# Patient Record
Sex: Female | Born: 1960 | Race: Black or African American | Hispanic: No | Marital: Single | Smoking: Never smoker
Health system: Southern US, Community
[De-identification: ages and names within clinical notes are randomized; demographics above are authoritative.]

## PROBLEM LIST (undated history)

## (undated) DIAGNOSIS — J40 Bronchitis, not specified as acute or chronic: Secondary | ICD-10-CM

## (undated) DIAGNOSIS — G4733 Obstructive sleep apnea (adult) (pediatric): Secondary | ICD-10-CM

## (undated) HISTORY — DX: Obstructive sleep apnea (adult) (pediatric): G47.33

## (undated) HISTORY — PX: REDUCTION MAMMAPLASTY: SUR839

## (undated) HISTORY — PX: POLYPECTOMY: SHX149

## (undated) HISTORY — PX: COLONOSCOPY: SHX174

## (undated) HISTORY — PX: ABDOMINAL HYSTERECTOMY: SHX81

---

## 2002-07-11 HISTORY — PX: BREAST REDUCTION SURGERY: SHX8

## 2006-07-11 HISTORY — PX: OTHER SURGICAL HISTORY: SHX169

## 2017-06-06 ENCOUNTER — Ambulatory Visit: Payer: Self-pay | Admitting: *Deleted

## 2017-06-06 ENCOUNTER — Encounter: Payer: Self-pay | Admitting: Emergency Medicine

## 2017-06-06 ENCOUNTER — Ambulatory Visit (INDEPENDENT_AMBULATORY_CARE_PROVIDER_SITE_OTHER): Payer: 59 | Admitting: Emergency Medicine

## 2017-06-06 VITALS — BP 122/72 | HR 89 | Temp 99.1°F | Resp 17 | Ht 62.0 in | Wt 179.0 lb

## 2017-06-06 DIAGNOSIS — R6889 Other general symptoms and signs: Secondary | ICD-10-CM | POA: Insufficient documentation

## 2017-06-06 DIAGNOSIS — J09X2 Influenza due to identified novel influenza A virus with other respiratory manifestations: Secondary | ICD-10-CM | POA: Diagnosis not present

## 2017-06-06 DIAGNOSIS — J040 Acute laryngitis: Secondary | ICD-10-CM

## 2017-06-06 LAB — POC INFLUENZA A&B (BINAX/QUICKVUE)
INFLUENZA A, POC: POSITIVE — AB
Influenza B, POC: NEGATIVE

## 2017-06-06 MED ORDER — PROMETHAZINE-CODEINE 6.25-10 MG/5ML PO SYRP: 5.0000 mL | ORAL_SOLUTION | Freq: Every evening | ORAL | 0 refills | Status: DC | PRN

## 2017-06-06 MED ORDER — AZITHROMYCIN 250 MG PO TABS: ORAL_TABLET | ORAL | 0 refills | Status: DC

## 2017-06-06 MED ORDER — PREDNISONE 20 MG PO TABS: 40.0000 mg | ORAL_TABLET | Freq: Every day | ORAL | 0 refills | Status: AC

## 2017-06-06 NOTE — Progress Notes (Signed)
Jane Heath 56 y.o.   Chief Complaint  Patient presents with  . Nasal Congestion  . Cough    HISTORY OF PRESENT ILLNESS: This is a 56 y.o. female complaining of cough and congestion x 5 days; returned from [facility] 8-9 days ago; losing voice and aching all over.  Influenza  This is a new problem. The current episode started in the past 7 days. The problem has been gradually worsening. Associated symptoms include arthralgias, congestion, coughing, fatigue, headaches, myalgias, a sore throat and weakness. Pertinent negatives include no abdominal pain, anorexia, chest pain, chills, fever, joint swelling, nausea, neck pain, rash, vertigo or vomiting. Treatments tried: cold medicines. The treatment provided no relief.     Prior to Admission medications   Medication Sig Start Date End Date Taking? Authorizing Provider  Phenylephrine-Pheniramine-DM ([place]) 04-30-19 MG PACK Take by mouth.   Yes [provider]    Not on File  There are no active problems to display for this patient.   No past medical history on file.    Social History   Socioeconomic History  . Marital status: Single    Spouse name: Not on file  . Number of children: Not on file  . Years of education: Not on file  . Highest education level: Not on file  Social Needs  . Financial resource strain: Not on file  . Food insecurity - worry: Not on file  . Food insecurity - inability: Not on file  . Transportation needs - medical: Not on file  . Transportation needs - non-medical: Not on file  Occupational History  . Not on file  Tobacco Use  . Smoking status: Never Smoker  . Smokeless tobacco: Never Used  Substance and Sexual Activity  . Alcohol use: Not on file  . Drug use: Not on file  . Sexual activity: Not on file  Other Topics Concern  . Not on file  Social History Narrative  . Not on file    No family history on file.   Review of Systems  Constitutional: Positive  for fatigue. Negative for chills and fever.  HENT: Positive for congestion and sore throat.        Loss of voice.  Eyes: Negative for discharge and redness.  Respiratory: Positive for cough. Negative for shortness of breath and wheezing.   Cardiovascular: Negative for chest pain and leg swelling.  Gastrointestinal: Negative for abdominal pain, anorexia, blood in stool, diarrhea, nausea and vomiting.  Genitourinary: Negative for dysuria, flank pain, frequency and hematuria.  Musculoskeletal: Positive for arthralgias and myalgias. Negative for joint swelling and neck pain.  Skin: Negative for rash.  Neurological: Positive for weakness and headaches. Negative for dizziness and vertigo.  Endo/Heme/Allergies: Negative.   All other systems reviewed and are negative.  Results for orders placed or performed in visit on 06/06/17 (from the past 24 hour(s))  POC Influenza A&B(BINAX/QUICKVUE)     Status: Abnormal   Collection Time: 06/06/17 12:25 PM  Result Value Ref Range   Influenza A, POC Positive (A) Negative   Influenza B, POC Negative Negative    Vitals:   06/06/17 1142  BP: 122/72  Pulse: 89  Resp: 17  Temp: 99.1 F (37.3 C)  SpO2: 98%    Physical Exam  Constitutional: She is oriented to person, place, and time. She appears well-developed and well-nourished. She appears ill.  HENT:  Head: Normocephalic and atraumatic.  Nose: Nose normal.  Mouth/Throat: Oropharynx is clear and moist.  Eyes: Conjunctivae and EOM are normal. Pupils are equal, round, and reactive to light.  Neck: Normal range of motion. Neck supple. No JVD present. No thyromegaly present.  Cardiovascular: Normal rate, regular rhythm, normal heart sounds and intact distal pulses.  Pulmonary/Chest: Effort normal and breath sounds normal.  Abdominal: Soft. Bowel sounds are normal. She exhibits no distension. There is no tenderness.  Musculoskeletal: Normal range of motion.  Lymphadenopathy:    She has no cervical  adenopathy.  Neurological: She is alert and oriented to person, place, and time. No sensory deficit. She exhibits normal muscle tone.  Skin: Skin is warm and dry. Capillary refill takes less than 2 seconds. No rash noted.  Psychiatric: She has a normal mood and affect. Her behavior is normal.  Vitals reviewed.    ASSESSMENT & PLAN: Jane Heath was seen today for nasal congestion and cough.  Diagnoses and all orders for this visit:  Flu-like symptoms -     POC Influenza A&B(BINAX/QUICKVUE)  Influenza due to identified novel influenza A virus with other respiratory manifestations  Laryngitis  Other orders -     predniSONE (DELTASONE) 20 MG tablet; Take 2 tablets (40 mg total) by mouth daily with breakfast for 5 days. -     azithromycin (ZITHROMAX) 250 MG tablet; Sig as indicated -     promethazine-codeine (PHENERGAN WITH CODEINE) 6.25-10 MG/5ML syrup; Take 5 mLs by mouth at bedtime as needed for cough.    Patient Instructions       IF you received an x-ray today, you will receive an invoice from [facility]. Please contact [facility] at [phone] with questions or concerns regarding your invoice.   IF you received labwork today, you will receive an invoice from [facility]. Please contact [facility] at [phone] with questions or concerns regarding your invoice.   Our billing staff will not be able to assist you with questions regarding bills from these companies.  You will be contacted with the lab results as soon as they are available. The fastest way to get your results is to activate your My Chart account. Instructions are located on the last page of this paperwork. If you have not heard from [place] regarding the results in 2 weeks, please contact this office.      Influenza, Adult Influenza ("the flu") is an infection in the lungs, nose, and throat (respiratory tract). It is caused by a virus. The flu causes many common cold symptoms, as well as a high fever  and body aches. It can make you feel very sick. The flu spreads easily from person to person (is contagious). Getting a flu shot (influenza vaccination) every year is the best way to prevent the flu. Follow these instructions at home:  Take over-the-counter and prescription medicines only as told by your doctor.  Use a cool mist humidifier to add moisture (humidity) to the air in your home. This can make it easier to breathe.  Rest as needed.  Drink enough fluid to keep your pee (urine) clear or pale yellow.  Cover your mouth and nose when you cough or sneeze.  Wash your hands with soap and water often, especially after you cough or sneeze. If you cannot use soap and water, use hand sanitizer.  Stay home from work or school as told by your doctor. Unless you are visiting your doctor, try to avoid leaving home until your fever has been gone for 24 hours without the use of medicine.  Keep all follow-up visits as told by  your doctor. This is important. How is this prevented?  Getting a yearly (annual) flu shot is the best way to avoid getting the flu. You may get the flu shot in late summer, fall, or winter. Ask your doctor when you should get your flu shot.  Wash your hands often or use hand sanitizer often.  Avoid contact with people who are sick during cold and flu season.  Eat healthy foods.  Drink plenty of fluids.  Get enough sleep.  Exercise regularly. Contact a doctor if:  You get new symptoms.  You have: ? Chest pain. ? Watery poop (diarrhea). ? A fever.  Your cough gets worse.  You start to have more mucus.  You feel sick to your stomach (nauseous).  You throw up (vomit). Get help right away if:  You start to be short of breath or have trouble breathing.  Your skin or nails turn a bluish color.  You have very bad pain or stiffness in your neck.  You get a sudden headache.  You get sudden pain in your face or ear.  You cannot stop throwing up. This  information is not intended to replace advice given to you by your health care provider. Make sure you discuss any questions you have with your health care provider. Document Released: 04/05/2008 Document Revised: 12/03/2015 Document Reviewed: 04/21/2015 Elsevier Interactive Patient Education  2017 Elsevier Inc.      [name], MD [facility]

## 2017-06-06 NOTE — Patient Instructions (Addendum)
     IF you received an x-ray today, you will receive an invoice from [facility]. Please contact [facility] at [phone] with questions or concerns regarding your invoice.   IF you received labwork today, you will receive an invoice from [facility]. Please contact [facility] at [phone] with questions or concerns regarding your invoice.   Our billing staff will not be able to assist you with questions regarding bills from these companies.  You will be contacted with the lab results as soon as they are available. The fastest way to get your results is to activate your My Chart account. Instructions are located on the last page of this paperwork. If you have not heard from us regarding the results in 2 weeks, please contact this office.      Influenza, Adult Influenza ("the flu") is an infection in the lungs, nose, and throat (respiratory tract). It is caused by a virus. The flu causes many common cold symptoms, as well as a high fever and body aches. It can make you feel very sick. The flu spreads easily from person to person (is contagious). Getting a flu shot (influenza vaccination) every year is the best way to prevent the flu. Follow these instructions at home:  Take over-the-counter and prescription medicines only as told by your doctor.  Use a cool mist humidifier to add moisture (humidity) to the air in your home. This can make it easier to breathe.  Rest as needed.  Drink enough fluid to keep your pee (urine) clear or pale yellow.  Cover your mouth and nose when you cough or sneeze.  Wash your hands with soap and water often, especially after you cough or sneeze. If you cannot use soap and water, use hand sanitizer.  Stay home from work or school as told by your doctor. Unless you are visiting your doctor, try to avoid leaving home until your fever has been gone for 24 hours without the use of medicine.  Keep all follow-up visits as told by your doctor.  This is important. How is this prevented?  Getting a yearly (annual) flu shot is the best way to avoid getting the flu. You may get the flu shot in late summer, fall, or winter. Ask your doctor when you should get your flu shot.  Wash your hands often or use hand sanitizer often.  Avoid contact with people who are sick during cold and flu season.  Eat healthy foods.  Drink plenty of fluids.  Get enough sleep.  Exercise regularly. Contact a doctor if:  You get new symptoms.  You have:  Chest pain.  Watery poop (diarrhea).  A fever.  Your cough gets worse.  You start to have more mucus.  You feel sick to your stomach (nauseous).  You throw up (vomit). Get help right away if:  You start to be short of breath or have trouble breathing.  Your skin or nails turn a bluish color.  You have very bad pain or stiffness in your neck.  You get a sudden headache.  You get sudden pain in your face or ear.  You cannot stop throwing up. This information is not intended to replace advice given to you by your health care provider. Make sure you discuss any questions you have with your health care provider. Document Released: 04/05/2008 Document Revised: 12/03/2015 Document Reviewed: 04/21/2015 Elsevier Interactive Patient Education  2017 Elsevier Inc.  

## 2017-06-06 NOTE — Telephone Encounter (Signed)
  Reason for Disposition . Patient sounds very sick or weak to the triager  Answer Assessment - Initial Assessment Questions 1. LOCATION: "Where does it hurt?"      Ear, teeth pain, chest pain 2. ONSET: "When did the sinus pain start?"  (e.g., hours, days)      Started last Friday morning 3. SEVERITY: "How bad is the pain?"   (Scale 1-10; mild, moderate or severe)   - MILD (1-3): doesn't interfere with normal activities    - MODERATE (4-7): interferes with normal activities (e.g., work or school) or awakens from sleep   - SEVERE (8-10): excruciating pain and patient unable to do any normal activities        7 4. RECURRENT SYMPTOM: "Have you ever had sinus problems before?" If so, ask: "When was the last time?" and "What happened that time?"      Never been this sick before 5. NASAL CONGESTION: "Is the nose blocked?" If so, ask, "Can you open it or must you breathe through the mouth?"     Patient had nose bleed this am- she did manage to get it to stop. She is congested- patient is breathing through her mouth 6. NASAL DISCHARGE: "Do you have discharge from your nose?" If so ask, "What color?"      White discharge 7. FEVER: "Do you have a fever?" If so, ask: "What is it, how was it measured, and when did it start?"      no 8. OTHER SYMPTOMS: "Do you have any other symptoms?" (e.g., sore throat, cough, earache, difficulty breathing)     Sore throat, cough, earache, difficulty breathing 9. PREGNANCY: "Is there any chance you are pregnant?" "When was your last menstrual period?"     n/a  Protocols used: SINUS PAIN OR CONGESTION-A-AH

## 2017-08-25 ENCOUNTER — Encounter: Payer: Self-pay | Admitting: Emergency Medicine

## 2017-08-25 ENCOUNTER — Other Ambulatory Visit: Payer: Self-pay

## 2017-08-25 ENCOUNTER — Ambulatory Visit (INDEPENDENT_AMBULATORY_CARE_PROVIDER_SITE_OTHER): Payer: 59 | Admitting: Emergency Medicine

## 2017-08-25 VITALS — BP 120/84 | HR 74 | Temp 97.8°F | Resp 16 | Ht 60.75 in | Wt 180.6 lb

## 2017-08-25 DIAGNOSIS — R4189 Other symptoms and signs involving cognitive functions and awareness: Secondary | ICD-10-CM | POA: Diagnosis not present

## 2017-08-25 DIAGNOSIS — R6889 Other general symptoms and signs: Secondary | ICD-10-CM | POA: Diagnosis not present

## 2017-08-25 NOTE — Patient Instructions (Signed)
     IF you received an x-ray today, you will receive an invoice from [facility]. Please contact [facility] at [phone] with questions or concerns regarding your invoice.   IF you received labwork today, you will receive an invoice from [facility]. Please contact [facility] at [phone] with questions or concerns regarding your invoice.   Our billing staff will not be able to assist you with questions regarding bills from these companies.  You will be contacted with the lab results as soon as they are available. The fastest way to get your results is to activate your My Chart account. Instructions are located on the last page of this paperwork. If you have not heard from us regarding the results in 2 weeks, please contact this office.     

## 2017-08-25 NOTE — Progress Notes (Signed)
Jane Heath 57 y.o.   Chief Complaint  Patient presents with  . Referral    Neuro, "I' am beginning to forget alot", going on for 7 years, have gotten worse in the last 2 yrs.    HISTORY OF PRESENT ILLNESS: This is a 57 y.o. female complaining of frequent episodes of forgetfulness getting worse the last year or 2.  Has had periods of confusion and disorientation.  Concerned about early onset dementia.  No other significant symptoms.  No significant past medical history.  No history of chronic diseases.  Presently on no medications.  There is some family history of dementia.  Stress level 7 out of 10.  HPI   Prior to Admission medications   Not on File    No Known Allergies  Patient Active Problem List   Diagnosis Date Noted  . Flu-like symptoms 06/06/2017  . Influenza due to identified novel influenza A virus with other respiratory manifestations 06/06/2017  . Laryngitis 06/06/2017    No past medical history on file.  No past surgical history on file.  Social History   Socioeconomic History  . Marital status: Single    Spouse name: Not on file  . Number of children: Not on file  . Years of education: Not on file  . Highest education level: Not on file  Social Needs  . Financial resource strain: Not on file  . Food insecurity - worry: Not on file  . Food insecurity - inability: Not on file  . Transportation needs - medical: Not on file  . Transportation needs - non-medical: Not on file  Occupational History  . Not on file  Tobacco Use  . Smoking status: Never Smoker  . Smokeless tobacco: Never Used  Substance and Sexual Activity  . Alcohol use: Not on file  . Drug use: Not on file  . Sexual activity: Not on file  Other Topics Concern  . Not on file  Social History Narrative  . Not on file    No family history on file.   Review of Systems  Constitutional: Negative.  Negative for chills, fever and weight loss.  HENT: Negative.  Negative for  congestion, nosebleeds and sore throat.   Eyes: Negative.  Negative for blurred vision and double vision.  Respiratory: Negative.  Negative for cough and shortness of breath.   Cardiovascular: Negative.  Negative for chest pain, palpitations and leg swelling.  Gastrointestinal: Negative.  Negative for abdominal pain, blood in stool, diarrhea, nausea and vomiting.  Genitourinary: Negative.  Negative for dysuria and hematuria.  Musculoskeletal: Negative.  Negative for back pain, myalgias and neck pain.  Skin: Negative.  Negative for rash.  Neurological: Negative.  Negative for dizziness, sensory change, speech change, focal weakness, seizures, loss of consciousness and headaches.  Endo/Heme/Allergies: Negative.   All other systems reviewed and are negative.   Vitals:   08/25/17 1738  BP: 120/84  Pulse: 74  Resp: 16  Temp: 97.8 F (36.6 C)  SpO2: 100%    Physical Exam  Constitutional: She is oriented to person, place, and time. She appears well-developed and well-nourished.  HENT:  Head: Normocephalic and atraumatic.  Right Ear: External ear normal.  Left Ear: External ear normal.  Nose: Nose normal.  Mouth/Throat: Oropharynx is clear and moist.  Eyes: Conjunctivae are normal. Pupils are equal, round, and reactive to light.  Neck: Normal range of motion. Neck supple. No JVD present. No thyromegaly present.  Cardiovascular: Normal rate, regular rhythm and normal heart  sounds.  Pulmonary/Chest: Effort normal and breath sounds normal.  Abdominal: Soft. Bowel sounds are normal. She exhibits no distension. There is no tenderness.  Musculoskeletal: Normal range of motion.  Lymphadenopathy:    She has no cervical adenopathy.  Neurological: She is alert and oriented to person, place, and time. No sensory deficit. She exhibits normal muscle tone.  Skin: Skin is warm and dry. Capillary refill takes less than 2 seconds. No rash noted.  Psychiatric: She has a normal mood and affect. Her  behavior is normal.  Vitals reviewed.    ASSESSMENT & PLAN: Jane Heath was seen today for referral.  Diagnoses and all orders for this visit:  Forgetfulness -     CBC with Differential/Platelet -     Comprehensive metabolic panel -     Hemoglobin A1c -     RPR -     HIV antibody -     Hepatitis C antibody -     Vitamin B12 -     TSH -     Ambulatory referral to Neurology  Episode of altered cognition -     Ambulatory referral to Neurology      [name], MD [facility]

## 2017-08-28 ENCOUNTER — Ambulatory Visit: Payer: Self-pay | Admitting: Family Medicine

## 2017-08-28 ENCOUNTER — Ambulatory Visit: Payer: 59

## 2017-08-29 ENCOUNTER — Encounter: Payer: Self-pay | Admitting: *Deleted

## 2017-08-29 LAB — COMPREHENSIVE METABOLIC PANEL
ALBUMIN: 4.5 g/dL (ref 3.5–5.5)
ALK PHOS: 63 IU/L (ref 39–117)
ALT: 16 IU/L (ref 0–32)
AST: 20 IU/L (ref 0–40)
Albumin/Globulin Ratio: 1.8 (ref 1.2–2.2)
BILIRUBIN TOTAL: 0.2 mg/dL (ref 0.0–1.2)
BUN / CREAT RATIO: 15 (ref 9–23)
BUN: 13 mg/dL (ref 6–24)
CHLORIDE: 102 mmol/L (ref 96–106)
CO2: 23 mmol/L (ref 20–29)
Calcium: 9.6 mg/dL (ref 8.7–10.2)
Creatinine, Ser: 0.86 mg/dL (ref 0.57–1.00)
GFR calc Af Amer: 87 mL/min/{1.73_m2} (ref >59)
GFR calc non Af Amer: 76 mL/min/{1.73_m2} (ref >59)
Globulin, Total: 2.5 g/dL (ref 1.5–4.5)
Glucose: 93 mg/dL (ref 65–99)
Potassium: 4.3 mmol/L (ref 3.5–5.2)
Sodium: 140 mmol/L (ref 134–144)
Total Protein: 7 g/dL (ref 6.0–8.5)

## 2017-08-29 LAB — CBC WITH DIFFERENTIAL/PLATELET
BASOS ABS: 0 10*3/uL (ref 0.0–0.2)
Basos: 1 %
EOS (ABSOLUTE): 0.1 10*3/uL (ref 0.0–0.4)
Eos: 2 %
HEMOGLOBIN: 13 g/dL (ref 11.1–15.9)
Hematocrit: 38.3 % (ref 34.0–46.6)
Immature Grans (Abs): 0 10*3/uL (ref 0.0–0.1)
Immature Granulocytes: 0 %
LYMPHS ABS: 1.4 10*3/uL (ref 0.7–3.1)
Lymphs: 21 %
MCH: 28.9 pg (ref 26.6–33.0)
MCHC: 33.9 g/dL (ref 31.5–35.7)
MCV: 85 fL (ref 79–97)
MONOCYTES: 6 %
MONOS ABS: 0.4 10*3/uL (ref 0.1–0.9)
NEUTROS ABS: 4.7 10*3/uL (ref 1.4–7.0)
Neutrophils: 70 %
PLATELETS: 290 10*3/uL (ref 150–379)
RBC: 4.5 x10E6/uL (ref 3.77–5.28)
RDW: 14 % (ref 12.3–15.4)
WBC: 6.6 10*3/uL (ref 3.4–10.8)

## 2017-08-29 LAB — TSH: TSH: 1.73 u[IU]/mL (ref 0.450–4.500)

## 2017-08-29 LAB — HEMOGLOBIN A1C
ESTIMATED AVERAGE GLUCOSE: 123 mg/dL
HEMOGLOBIN A1C: 5.9 % — AB (ref 4.8–5.6)

## 2017-08-29 LAB — HEPATITIS C ANTIBODY: Hep C Virus Ab: <0.1 s/co ratio (ref 0.0–0.9)

## 2017-08-29 LAB — HIV ANTIBODY (ROUTINE TESTING W REFLEX): HIV Screen 4th Generation wRfx: NONREACTIVE

## 2017-08-29 LAB — VITAMIN B12: Vitamin B-12: >2000 pg/mL — ABNORMAL HIGH (ref 232–1245)

## 2017-08-29 LAB — RPR: RPR Ser Ql: NONREACTIVE

## 2017-11-10 ENCOUNTER — Encounter: Payer: Self-pay | Admitting: Diagnostic Neuroimaging

## 2017-11-10 ENCOUNTER — Ambulatory Visit (INDEPENDENT_AMBULATORY_CARE_PROVIDER_SITE_OTHER): Payer: 59 | Admitting: Diagnostic Neuroimaging

## 2017-11-10 VITALS — BP 127/82 | HR 72 | Ht 60.75 in | Wt 188.0 lb

## 2017-11-10 DIAGNOSIS — R413 Other amnesia: Secondary | ICD-10-CM | POA: Diagnosis not present

## 2017-11-10 DIAGNOSIS — G4733 Obstructive sleep apnea (adult) (pediatric): Secondary | ICD-10-CM
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
## 2017-11-10 NOTE — Patient Instructions (Signed)
  Thank you for coming to see [place] at [facility]. I hope we have been able to provide you high quality care today.  You may receive a patient satisfaction survey over the next few weeks. We would appreciate your feedback and comments so that we may continue to improve ourselves and the health of our patients.  - check MRI brain, sleep study, neuropsychology testing   ~~~~~~~~~~~~~~~~~~~~~~~~~~~~~~~~~~~~~~~~~~~~~~~~~~~~~~~~~~~~~~~~~  DR. Manson Luckadoo'S GUIDE TO HAPPY AND HEALTHY LIVING These are some of my general health and wellness recommendations. Some of them may apply to you better than others. Please use common sense as you try these suggestions and feel free to ask me any questions.   ACTIVITY/FITNESS Mental, social, emotional and physical stimulation are very important for brain and body health. Try learning a new activity (arts, music, language, sports, games).  Keep moving your body to the best of your abilities. You can do this at home, inside or outside, the park, community center, gym or anywhere you like. Consider a physical therapist or personal trainer to get started. Fitness trackers, smart-watches or  smart-phones can help as well.   NUTRITION Eat more plants: colorful vegetables, nuts, seeds and berries.  Eat less sugar, salt, preservatives and processed foods.  Avoid toxins such as cigarettes and alcohol.  Drink water when you are thirsty. Warm water with a slice of lemon is an excellent morning drink to start the day.  Consider these websites for more information The Nutrition Source (https://www.henry-hernandez.biz/) Precision Nutrition (WindowBlog.ch)   RELAXATION Consider practicing mindfulness meditation or other relaxation techniques such as deep breathing, prayer, yoga, tai chi, massage. See website mindful.org or the apps Headspace or Calm to help get started.   SLEEP Try to get at least 7-8+  hours sleep per day. Regular exercise and reduced caffeine will help you sleep better. Practice good sleep hygeine techniques. See website sleep.org for more information.   PLANNING Prepare estate planning, living will, healthcare POA documents. Sometimes this is best planned with the help of an attorney. Theconversationproject.org and agingwithdignity.org are excellent resources.

## 2017-11-14 ENCOUNTER — Ambulatory Visit: Payer: 59

## 2017-11-14 DIAGNOSIS — R413 Other amnesia: Secondary | ICD-10-CM

## 2017-11-14 MED ORDER — GADOPENTETATE DIMEGLUMINE 469.01 MG/ML IV SOLN
17.0000 mL | Freq: Once | INTRAVENOUS | Status: AC | PRN
  Administered 2017-11-14: 17 mL via INTRAVENOUS

## 2017-11-20 ENCOUNTER — Encounter: Payer: Self-pay | Admitting: Psychology

## 2017-11-24 ENCOUNTER — Telehealth: Payer: Self-pay | Admitting: *Deleted

## 2017-11-24 NOTE — Telephone Encounter (Signed)
-----   Message from Penni Bombard, MD sent at 11/23/2017  3:22 PM EDT ----- Unremarkable imaging results. Please call patient. Continue current plan. -VRP

## 2017-11-24 NOTE — Telephone Encounter (Signed)
Pt returned call and is asking more questions regarding her MRI, what matter changes, benign cyst.  Please call [phone].

## 2017-11-24 NOTE — Telephone Encounter (Signed)
LMVM for pt that her MRI unremarkable. Continue current plan.  She is to call back If questions.

## 2017-11-28 NOTE — Telephone Encounter (Signed)
I called patient. Reviewed MRI results. No major findings. Continue current plan. -VRP

## 2017-12-07 ENCOUNTER — Telehealth: Payer: Self-pay | Admitting: Diagnostic Neuroimaging

## 2017-12-07 NOTE — Telephone Encounter (Signed)
LMVM for pt to return call.   

## 2017-12-07 NOTE — Telephone Encounter (Signed)
Pt requesting a call, stating she would like to discuss her memory related issues. Pt didn't wan to go into further detail with me

## 2017-12-08 NOTE — Telephone Encounter (Signed)
I spoke to pt and she was dismissed from her job and now has no insurance.  She had MRI, but the other sleep consult and neuropsych appts are scheduled at this point.  She was given dates and times of these appts and I relayed that if it comes close to time and she still does not have insurance she will call prior to 24 hours to cancell so as not to be  Charged a no show fee.   She was appreciative of help and verbalized understanding.

## 2018-01-17 ENCOUNTER — Institutional Professional Consult (permissible substitution): Payer: 59 | Admitting: Neurology

## 2018-02-08 ENCOUNTER — Encounter: Payer: Self-pay | Admitting: Diagnostic Neuroimaging

## 2018-04-24 ENCOUNTER — Encounter: Payer: Self-pay | Admitting: Psychology
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
## 2018-05-22 ENCOUNTER — Ambulatory Visit: Payer: 59 | Admitting: Diagnostic Neuroimaging

## 2018-05-31 ENCOUNTER — Encounter: Payer: Self-pay | Admitting: Psychology

## 2018-06-04 ENCOUNTER — Ambulatory Visit: Payer: Self-pay | Admitting: Diagnostic Neuroimaging

## 2018-09-20 ENCOUNTER — Ambulatory Visit (INDEPENDENT_AMBULATORY_CARE_PROVIDER_SITE_OTHER): Payer: BLUE CROSS/BLUE SHIELD | Admitting: Emergency Medicine

## 2018-09-20 ENCOUNTER — Other Ambulatory Visit: Payer: Self-pay

## 2018-09-20 ENCOUNTER — Encounter: Payer: Self-pay | Admitting: Emergency Medicine

## 2018-09-20 VITALS — BP 130/87 | HR 81 | Temp 98.1°F | Resp 16 | Wt 185.0 lb

## 2018-09-20 DIAGNOSIS — Z13220 Encounter for screening for lipoid disorders: Secondary | ICD-10-CM

## 2018-09-20 DIAGNOSIS — Z1329 Encounter for screening for other suspected endocrine disorder: Secondary | ICD-10-CM | POA: Diagnosis not present

## 2018-09-20 DIAGNOSIS — Z0001 Encounter for general adult medical examination with abnormal findings: Secondary | ICD-10-CM

## 2018-09-20 DIAGNOSIS — F419 Anxiety disorder, unspecified: Secondary | ICD-10-CM | POA: Diagnosis not present

## 2018-09-20 DIAGNOSIS — Z1211 Encounter for screening for malignant neoplasm of colon: Secondary | ICD-10-CM

## 2018-09-20 DIAGNOSIS — R7303 Prediabetes: Secondary | ICD-10-CM

## 2018-09-20 DIAGNOSIS — Z1239 Encounter for other screening for malignant neoplasm of breast: Secondary | ICD-10-CM

## 2018-09-20 DIAGNOSIS — R6889 Other general symptoms and signs: Secondary | ICD-10-CM

## 2018-09-20 DIAGNOSIS — Z0000 Encounter for general adult medical examination without abnormal findings: Secondary | ICD-10-CM

## 2018-09-20 DIAGNOSIS — Z13228 Encounter for screening for other metabolic disorders: Secondary | ICD-10-CM | POA: Diagnosis not present

## 2018-09-20 DIAGNOSIS — Z130 Encounter for screening for diseases of the blood and blood-forming organs and certain disorders involving the immune mechanism: Secondary | ICD-10-CM

## 2018-09-20 LAB — POCT GLYCOSYLATED HEMOGLOBIN (HGB A1C): Hemoglobin A1C: 5.9 % — AB (ref 4.0–5.6)

## 2018-09-20 LAB — GLUCOSE, POCT (MANUAL RESULT ENTRY): POC Glucose: 90 mg/dl (ref 70–99)

## 2018-09-20 NOTE — Progress Notes (Signed)
Jane Heath 58 y.o.   No chief complaint on file.   HISTORY OF PRESENT ILLNESS: This is a 58 y.o. female here for annual exam. Complaining of chronic anxiety and depression.  Also has a history of forgetfulness, referred in the past to [facility] requesting referral back to them. Past medical history of prediabetes.  Was on metformin for some time.  Presently not taking any medication. Also has a history of a total hysterectomy in the past. No other complaints or medical concerns at this time. HPI   Prior to Admission medications   Medication Sig Start Date End Date Taking? Authorizing Provider  metFORMIN (GLUCOPHAGE) 500 MG tablet Take 500 mg by mouth daily.    [provider]    No Known Allergies  Patient Active Problem List   Diagnosis Date Noted  . Flu-like symptoms 06/06/2017  . Influenza due to identified novel influenza A virus with other respiratory manifestations 06/06/2017  . Laryngitis 06/06/2017    No past medical history on file.  Past Surgical History:  Procedure Laterality Date  . ABDOMINAL HYSTERECTOMY     1987  . BREAST REDUCTION SURGERY  2004  . tummy tuck  2008    Social History   Socioeconomic History  . Marital status: Single    Spouse name: Not on file  . Number of children: Not on file  . Years of education: Not on file  . Highest education level: Not on file  Occupational History  . Not on file  Social Needs  . Financial resource strain: Not on file  . Food insecurity:    Worry: Not on file    Inability: Not on file  . Transportation needs:    Medical: Not on file    Non-medical: Not on file  Tobacco Use  . Smoking status: Never Smoker  . Smokeless tobacco: Never Used  Substance and Sexual Activity  . Alcohol use: Yes    Comment: 2 x week  . Drug use: Never  . Sexual activity: Not on file  Lifestyle  . Physical activity:    Days per week: Not on file    Minutes per session: Not on file   . Stress: Not on file  Relationships  . Social connections:    Talks on phone: Not on file    Gets together: Not on file    Attends religious service: Not on file    Active member of club or organization: Not on file    Attends meetings of clubs or organizations: Not on file    Relationship status: Not on file  . Intimate partner violence:    Fear of current or ex partner: Not on file    Emotionally abused: Not on file    Physically abused: Not on file    Forced sexual activity: Not on file  Other Topics Concern  . Not on file  Social History Narrative   Lives with mother.  Works for [employer].  Education college 2 yrs.  Divorced.  No children.  Caffeine 12-16 oz daily.    Family History  Problem Relation Age of Onset  . Hypertension Mother   . Heart disease Mother   . Hypertension Sister   . Diabetes Brother   . Hypertension Brother   . Heart disease Maternal Grandmother   . Alzheimer's disease Other    Depression screen [facility] 2/9 08/25/2017 06/06/2017  Decreased Interest 0 0  Down, Depressed, Hopeless 0 0  PHQ - 2 Score  0 0     Review of Systems  Constitutional: Negative.  Negative for chills, fever and weight loss.  HENT: Negative.   Eyes: Negative.  Negative for blurred vision and double vision.  Respiratory: Negative.  Negative for cough and shortness of breath.   Cardiovascular: Negative.  Negative for chest pain and palpitations.  Gastrointestinal: Negative.  Negative for abdominal pain, diarrhea, nausea and vomiting.  Genitourinary: Negative.  Negative for dysuria.  Musculoskeletal: Negative.  Negative for myalgias and neck pain.  Skin: Negative.  Negative for rash.  Neurological: Negative.  Negative for dizziness and headaches.  Psychiatric/Behavioral: Positive for depression. The patient is nervous/anxious.   All other systems reviewed and are negative.   Vitals:   09/20/18 1105  BP: 130/87  Pulse: 81  Resp: 16  Temp: 98.1 F (36.7 C)  SpO2: 96%     Physical Exam Vitals signs reviewed.  Constitutional:      Appearance: Normal appearance.  HENT:     Head: Normocephalic and atraumatic.     Nose: Nose normal.     Mouth/Throat:     Mouth: Mucous membranes are moist.     Pharynx: Oropharynx is clear.  Eyes:     Extraocular Movements: Extraocular movements intact.     Conjunctiva/sclera: Conjunctivae normal.     Pupils: Pupils are equal, round, and reactive to light.  Neck:     Musculoskeletal: Normal range of motion.  Cardiovascular:     Rate and Rhythm: Normal rate and regular rhythm.     Pulses: Normal pulses.     Heart sounds: Normal heart sounds.  Pulmonary:     Effort: Pulmonary effort is normal.     Breath sounds: Normal breath sounds.  Abdominal:     General: There is no distension.     Palpations: Abdomen is soft. There is no mass.     Tenderness: There is no abdominal tenderness.  Musculoskeletal: Normal range of motion.  Skin:    General: Skin is warm and dry.     Capillary Refill: Capillary refill takes less than 2 seconds.  Neurological:     General: No focal deficit present.     Mental Status: She is alert and oriented to person, place, and time.     Sensory: No sensory deficit.     Motor: No weakness.  Psychiatric:        Mood and Affect: Mood normal.        Behavior: Behavior normal.      Results for orders placed or performed in visit on 09/20/18 (from the past 24 hour(s))  POCT glucose (manual entry)     Status: Normal   Collection Time: 09/20/18 10:45 AM  Result Value Ref Range   POC Glucose 90 70 - 99 mg/dl  POCT glycosylated hemoglobin (Hb A1C)     Status: Abnormal   Collection Time: 09/20/18 10:45 AM  Result Value Ref Range   Hemoglobin A1C 5.9 (A) 4.0 - 5.6 %   HbA1c POC (<> result, manual entry)     HbA1c, POC (prediabetic range)     HbA1c, POC (controlled diabetic range)       ASSESSMENT & PLAN: Jane Heath was seen today for annual exam.  Diagnoses and all orders for this  visit:  Routine general medical examination at a health care facility -     Comprehensive metabolic panel  Screening for deficiency anemia -     CBC with Differential/Platelet  Screening for lipoid disorders -  Lipid panel  Screening for endocrine, metabolic and immunity disorder -     Comprehensive metabolic panel -     Lipid panel -     TSH  Prediabetes -     POCT glucose (manual entry) -     POCT glycosylated hemoglobin (Hb A1C) -     Ambulatory referral to Ophthalmology  Chronic anxiety -     Ambulatory referral to Psychiatry  Forgetfulness -     Ambulatory referral to Neurology  Colon cancer screening -     Ambulatory referral to Gastroenterology  Breast cancer screening -     MM Digital Screening; Future     Patient Instructions   Health Maintenance, Female Adopting a healthy lifestyle and getting preventive care can go a long way to promote health and wellness. Talk with your health care provider about what schedule of regular examinations is right for you. This is a good chance for you to check in with your provider about disease prevention and staying healthy. In between checkups, there are plenty of things you can do on your own. Experts have done a lot of research about which lifestyle changes and preventive measures are most likely to keep you healthy. Ask your health care provider for more information. Weight and diet Eat a healthy diet  Be sure to include plenty of vegetables, fruits, low-fat dairy products, and lean protein.  Do not eat a lot of foods high in solid fats, added sugars, or salt.  Get regular exercise. This is one of the most important things you can do for your health. ? Most adults should exercise for at least 150 minutes each week. The exercise should increase your heart rate and make you sweat (moderate-intensity exercise). ? Most adults should also do strengthening exercises at least twice a week. This is in addition to the  moderate-intensity exercise. Maintain a healthy weight  Body mass index (BMI) is a measurement that can be used to identify possible weight problems. It estimates body fat based on height and weight. Your health care provider can help determine your BMI and help you achieve or maintain a healthy weight.  For females 56 years of age and older: ? A BMI below 18.5 is considered underweight. ? A BMI of 18.5 to 24.9 is normal. ? A BMI of 25 to 29.9 is considered overweight. ? A BMI of 30 and above is considered obese. Watch levels of cholesterol and blood lipids  You should start having your blood tested for lipids and cholesterol at 58 years of age, then have this test every 5 years.  You may need to have your cholesterol levels checked more often if: ? Your lipid or cholesterol levels are high. ? You are older than 58 years of age. ? You are at high risk for heart disease. Cancer screening Lung Cancer  Lung cancer screening is recommended for adults 62-53 years old who are at high risk for lung cancer because of a history of smoking.  A yearly low-dose CT scan of the lungs is recommended for people who: ? Currently smoke. ? Have quit within the past 15 years. ? Have at least a 30-pack-year history of smoking. A pack year is smoking an average of one pack of cigarettes a day for 1 year.  Yearly screening should continue until it has been 15 years since you quit.  Yearly screening should stop if you develop a health problem that would prevent you from having lung cancer treatment. Breast Cancer  Practice breast self-awareness. This means understanding how your breasts normally appear and feel.  It also means doing regular breast self-exams. Let your health care provider know about any changes, no matter how small.  If you are in your 20s or 30s, you should have a clinical breast exam (CBE) by a health care provider every 1-3 years as part of a regular health exam.  If you are 59 or  older, have a CBE every year. Also consider having a breast X-ray (mammogram) every year.  If you have a family history of breast cancer, talk to your health care provider about genetic screening.  If you are at high risk for breast cancer, talk to your health care provider about having an MRI and a mammogram every year.  Breast cancer gene (BRCA) assessment is recommended for women who have family members with BRCA-related cancers. BRCA-related cancers include: ? Breast. ? Ovarian. ? Tubal. ? Peritoneal cancers.  Results of the assessment will determine the need for genetic counseling and BRCA1 and BRCA2 testing. Cervical Cancer Your health care provider may recommend that you be screened regularly for cancer of the pelvic organs (ovaries, uterus, and vagina). This screening involves a pelvic examination, including checking for microscopic changes to the surface of your cervix (Pap test). You may be encouraged to have this screening done every 3 years, beginning at age 52.  For women ages 9-65, health care providers may recommend pelvic exams and Pap testing every 3 years, or they may recommend the Pap and pelvic exam, combined with testing for human papilloma virus (HPV), every 5 years. Some types of HPV increase your risk of cervical cancer. Testing for HPV may also be done on women of any age with unclear Pap test results.  Other health care providers may not recommend any screening for nonpregnant women who are considered low risk for pelvic cancer and who do not have symptoms. Ask your health care provider if a screening pelvic exam is right for you.  If you have had past treatment for cervical cancer or a condition that could lead to cancer, you need Pap tests and screening for cancer for at least 20 years after your treatment. If Pap tests have been discontinued, your risk factors (such as having a new sexual partner) need to be reassessed to determine if screening should resume. Some  women have medical problems that increase the chance of getting cervical cancer. In these cases, your health care provider may recommend more frequent screening and Pap tests. Colorectal Cancer  This type of cancer can be detected and often prevented.  Routine colorectal cancer screening usually begins at 58 years of age and continues through 58 years of age.  Your health care provider may recommend screening at an earlier age if you have risk factors for colon cancer.  Your health care provider may also recommend using home test kits to check for hidden blood in the stool.  A small camera at the end of a tube can be used to examine your colon directly (sigmoidoscopy or colonoscopy). This is done to check for the earliest forms of colorectal cancer.  Routine screening usually begins at age 79.  Direct examination of the colon should be repeated every 5-10 years through 58 years of age. However, you may need to be screened more often if early forms of precancerous polyps or small growths are found. Skin Cancer  Check your skin from head to toe regularly.  Tell your health care provider about any  new moles or changes in moles, especially if there is a change in a mole's shape or color.  Also tell your health care provider if you have a mole that is larger than the size of a pencil eraser.  Always use sunscreen. Apply sunscreen liberally and repeatedly throughout the day.  Protect yourself by wearing long sleeves, pants, a wide-brimmed hat, and sunglasses whenever you are outside. Heart disease, diabetes, and high blood pressure  High blood pressure causes heart disease and increases the risk of stroke. High blood pressure is more likely to develop in: ? People who have blood pressure in the high end of the normal range (130-139/85-89 mm Hg). ? People who are overweight or obese. ? People who are African American.  If you are 41-7 years of age, have your blood pressure checked every  3-5 years. If you are 63 years of age or older, have your blood pressure checked every year. You should have your blood pressure measured twice-once when you are at a hospital or clinic, and once when you are not at a hospital or clinic. Record the average of the two measurements. To check your blood pressure when you are not at a hospital or clinic, you can use: ? An automated blood pressure machine at a pharmacy. ? A home blood pressure monitor.  If you are between 42 years and 50 years old, ask your health care provider if you should take aspirin to prevent strokes.  Have regular diabetes screenings. This involves taking a blood sample to check your fasting blood sugar level. ? If you are at a normal weight and have a low risk for diabetes, have this test once every three years after 58 years of age. ? If you are overweight and have a high risk for diabetes, consider being tested at a younger age or more often. Preventing infection Hepatitis B  If you have a higher risk for hepatitis B, you should be screened for this virus. You are considered at high risk for hepatitis B if: ? You were born in a country where hepatitis B is common. Ask your health care provider which countries are considered high risk. ? Your parents were born in a high-risk country, and you have not been immunized against hepatitis B (hepatitis B vaccine). ? You have HIV or AIDS. ? You use needles to inject street drugs. ? You live with someone who has hepatitis B. ? You have had sex with someone who has hepatitis B. ? You get hemodialysis treatment. ? You take certain medicines for conditions, including cancer, organ transplantation, and autoimmune conditions. Hepatitis C  Blood testing is recommended for: ? Everyone born from [birth year] through 1965. ? Anyone with known risk factors for hepatitis C. Sexually transmitted infections (STIs)  You should be screened for sexually transmitted infections (STIs) including  gonorrhea and chlamydia if: ? You are sexually active and are younger than 58 years of age. ? You are older than 58 years of age and your health care provider tells you that you are at risk for this type of infection. ? Your sexual activity has changed since you were last screened and you are at an increased risk for chlamydia or gonorrhea. Ask your health care provider if you are at risk.  If you do not have HIV, but are at risk, it may be recommended that you take a prescription medicine daily to prevent HIV infection. This is called pre-exposure prophylaxis (PrEP). You are considered at risk if: ?  You are sexually active and do not regularly use condoms or know the HIV status of your partner(s). ? You take drugs by injection. ? You are sexually active with a partner who has HIV. Talk with your health care provider about whether you are at high risk of being infected with HIV. If you choose to begin PrEP, you should first be tested for HIV. You should then be tested every 3 months for as long as you are taking PrEP. Pregnancy  If you are premenopausal and you may become pregnant, ask your health care provider about preconception counseling.  If you may become pregnant, take 400 to 800 micrograms (mcg) of folic acid every day.  If you want to prevent pregnancy, talk to your health care provider about birth control (contraception). Osteoporosis and menopause  Osteoporosis is a disease in which the bones lose minerals and strength with aging. This can result in serious bone fractures. Your risk for osteoporosis can be identified using a bone density scan.  If you are 43 years of age or older, or if you are at risk for osteoporosis and fractures, ask your health care provider if you should be screened.  Ask your health care provider whether you should take a calcium or vitamin D supplement to lower your risk for osteoporosis.  Menopause may have certain physical symptoms and risks.  Hormone  replacement therapy may reduce some of these symptoms and risks. Talk to your health care provider about whether hormone replacement therapy is right for you. Follow these instructions at home:  Schedule regular health, dental, and eye exams.  Stay current with your immunizations.  Do not use any tobacco products including cigarettes, chewing tobacco, or electronic cigarettes.  If you are pregnant, do not drink alcohol.  If you are breastfeeding, limit how much and how often you drink alcohol.  Limit alcohol intake to no more than 1 drink per day for nonpregnant women. One drink equals 12 ounces of beer, 5 ounces of wine, or 1 ounces of hard liquor.  Do not use street drugs.  Do not share needles.  Ask your health care provider for help if you need support or information about quitting drugs.  Tell your health care provider if you often feel depressed.  Tell your health care provider if you have ever been abused or do not feel safe at home. This information is not intended to replace advice given to you by your health care provider. Make sure you discuss any questions you have with your health care provider. Document Released: 01/10/2011 Document Revised: 12/03/2015 Document Reviewed: 03/31/2015 Elsevier Interactive Patient Education  2019 Elsevier Inc.      [name], MD [facility]

## 2018-09-20 NOTE — Patient Instructions (Signed)

## 2018-09-21 ENCOUNTER — Encounter: Payer: Self-pay | Admitting: Radiology

## 2018-09-21 LAB — COMPREHENSIVE METABOLIC PANEL
A/G RATIO: 1.9 (ref 1.2–2.2)
ALT: 18 IU/L (ref 0–32)
AST: 27 IU/L (ref 0–40)
Albumin: 4.8 g/dL (ref 3.8–4.9)
Alkaline Phosphatase: 60 IU/L (ref 39–117)
BILIRUBIN TOTAL: 0.2 mg/dL (ref 0.0–1.2)
BUN/Creatinine Ratio: 16 (ref 9–23)
BUN: 13 mg/dL (ref 6–24)
CHLORIDE: 100 mmol/L (ref 96–106)
CO2: 22 mmol/L (ref 20–29)
Calcium: 9.7 mg/dL (ref 8.7–10.2)
Creatinine, Ser: 0.83 mg/dL (ref 0.57–1.00)
GFR calc Af Amer: 90 mL/min/{1.73_m2} (ref >59)
GFR calc non Af Amer: 78 mL/min/{1.73_m2} (ref >59)
GLOBULIN, TOTAL: 2.5 g/dL (ref 1.5–4.5)
Glucose: 97 mg/dL (ref 65–99)
Potassium: 4.4 mmol/L (ref 3.5–5.2)
SODIUM: 137 mmol/L (ref 134–144)
Total Protein: 7.3 g/dL (ref 6.0–8.5)

## 2018-09-21 LAB — CBC WITH DIFFERENTIAL/PLATELET
BASOS: 1 %
Basophils Absolute: 0.1 10*3/uL (ref 0.0–0.2)
EOS (ABSOLUTE): 0.1 10*3/uL (ref 0.0–0.4)
Eos: 2 %
Hematocrit: 37.1 % (ref 34.0–46.6)
Hemoglobin: 12.5 g/dL (ref 11.1–15.9)
IMMATURE GRANULOCYTES: 0 %
Immature Grans (Abs): 0 10*3/uL (ref 0.0–0.1)
Lymphocytes Absolute: 1.6 10*3/uL (ref 0.7–3.1)
Lymphs: 24 %
MCH: 27.7 pg (ref 26.6–33.0)
MCHC: 33.7 g/dL (ref 31.5–35.7)
MCV: 82 fL (ref 79–97)
MONOS ABS: 0.4 10*3/uL (ref 0.1–0.9)
Monocytes: 6 %
NEUTROS PCT: 67 %
Neutrophils Absolute: 4.5 10*3/uL (ref 1.4–7.0)
PLATELETS: 288 10*3/uL (ref 150–450)
RBC: 4.51 x10E6/uL (ref 3.77–5.28)
RDW: 12.7 % (ref 11.7–15.4)
WBC: 6.7 10*3/uL (ref 3.4–10.8)

## 2018-09-21 LAB — LIPID PANEL
Chol/HDL Ratio: 4.6 ratio — ABNORMAL HIGH (ref 0.0–4.4)
Cholesterol, Total: 225 mg/dL — ABNORMAL HIGH (ref 100–199)
HDL: 49 mg/dL (ref >39)
LDL Calculated: 152 mg/dL — ABNORMAL HIGH (ref 0–99)
Triglycerides: 118 mg/dL (ref 0–149)
VLDL CHOLESTEROL CAL: 24 mg/dL (ref 5–40)

## 2018-09-21 LAB — TSH: TSH: 1.91 u[IU]/mL (ref 0.450–4.500)

## 2018-09-21 NOTE — Progress Notes (Signed)
The 10-year ASCVD risk score Mikey Bussing DC Brooke Bonito., et al., 2013) is: 5.5%   Values used to calculate the score:     Age: 58 years     Sex: Female     Is Non-Hispanic African American: Yes     Diabetic: No     Tobacco smoker: No     Systolic Blood Pressure: 715 mmHg     Is BP treated: No     HDL Cholesterol: 49 mg/dL     Total Cholesterol: 225 mg/dL

## 2019-05-20 DIAGNOSIS — R413 Other amnesia: Secondary | ICD-10-CM | POA: Diagnosis not present

## 2019-05-20 DIAGNOSIS — Z833 Family history of diabetes mellitus: Secondary | ICD-10-CM | POA: Diagnosis not present

## 2019-05-20 DIAGNOSIS — R635 Abnormal weight gain: Secondary | ICD-10-CM | POA: Diagnosis not present

## 2019-05-20 DIAGNOSIS — Z13220 Encounter for screening for lipoid disorders: Secondary | ICD-10-CM | POA: Diagnosis not present

## 2019-05-30 DIAGNOSIS — D485 Neoplasm of uncertain behavior of skin: Secondary | ICD-10-CM | POA: Diagnosis not present

## 2019-05-30 DIAGNOSIS — L2089 Other atopic dermatitis: Secondary | ICD-10-CM | POA: Diagnosis not present

## 2019-05-30 DIAGNOSIS — L821 Other seborrheic keratosis: Secondary | ICD-10-CM | POA: Diagnosis not present

## 2019-05-30 DIAGNOSIS — L853 Xerosis cutis: Secondary | ICD-10-CM | POA: Diagnosis not present

## 2019-06-07 DIAGNOSIS — H18831 Recurrent erosion of cornea, right eye: Secondary | ICD-10-CM | POA: Diagnosis not present

## 2019-06-11 ENCOUNTER — Other Ambulatory Visit: Payer: Self-pay | Admitting: *Deleted

## 2019-06-11 DIAGNOSIS — Z20822 Contact with and (suspected) exposure to covid-19: Secondary | ICD-10-CM

## 2019-06-11 DIAGNOSIS — R569 Unspecified convulsions: Secondary | ICD-10-CM

## 2019-06-11 HISTORY — DX: Unspecified convulsions: R56.9

## 2019-06-14 LAB — NOVEL CORONAVIRUS, NAA: SARS-CoV-2, NAA: NOT DETECTED

## 2019-06-25 DIAGNOSIS — G4730 Sleep apnea, unspecified: Secondary | ICD-10-CM | POA: Diagnosis not present

## 2019-06-27 ENCOUNTER — Other Ambulatory Visit: Payer: Self-pay

## 2019-06-27 ENCOUNTER — Ambulatory Visit: Payer: BC Managed Care – PPO | Attending: Internal Medicine

## 2019-06-27 DIAGNOSIS — Z20828 Contact with and (suspected) exposure to other viral communicable diseases: Secondary | ICD-10-CM | POA: Diagnosis not present

## 2019-06-27 DIAGNOSIS — Z20822 Contact with and (suspected) exposure to covid-19: Secondary | ICD-10-CM

## 2019-06-29 LAB — NOVEL CORONAVIRUS, NAA: SARS-CoV-2, NAA: NOT DETECTED

## 2019-07-12 DIAGNOSIS — U071 COVID-19: Secondary | ICD-10-CM

## 2019-07-12 HISTORY — DX: COVID-19: U07.1

## 2019-07-21 ENCOUNTER — Ambulatory Visit
Admission: EM | Admit: 2019-07-21 | Discharge: 2019-07-21 | Disposition: A | Discharge status: Home / Self Care | Payer: BC Managed Care – PPO | Attending: Emergency Medicine | Admitting: Emergency Medicine

## 2019-07-21 ENCOUNTER — Other Ambulatory Visit: Payer: Self-pay

## 2019-07-21 DIAGNOSIS — Z20822 Contact with and (suspected) exposure to covid-19: Secondary | ICD-10-CM | POA: Diagnosis not present

## 2019-07-21 DIAGNOSIS — J069 Acute upper respiratory infection, unspecified: Secondary | ICD-10-CM

## 2019-07-21 HISTORY — DX: Bronchitis, not specified as acute or chronic: J40

## 2019-07-21 MED ORDER — FLUTICASONE PROPIONATE 50 MCG/ACT NA SUSP: 2.0000 | Freq: Every day | NASAL | 0 refills | Status: DC

## 2019-07-21 MED ORDER — BENZONATATE 100 MG PO CAPS: 100.0000 mg | ORAL_CAPSULE | Freq: Three times a day (TID) | ORAL | 0 refills | Status: DC

## 2019-07-21 MED ORDER — CETIRIZINE HCL 10 MG PO TABS: 10.0000 mg | ORAL_TABLET | Freq: Every day | ORAL | 0 refills | Status: DC

## 2019-07-21 NOTE — Discharge Instructions (Signed)

## 2019-07-21 NOTE — ED Provider Notes (Signed)
[place]   [id number] 07/21/19 Arrival Time: 0820   CC: COVID symptoms  SUBJECTIVE: History from: patient.  Jane Heath is a 59 y.o. female who presents with dry cough, runny nose, PND, and fatigue x 2 day.  Denies sick exposure to COVID, flu or strep.  Denies recent travel.  Has tried OTC medications without relief.  Denies aggravating factors.  Reports previous symptoms in the past with bronchitis.   Denies fever, chills, sinus pain, sore throat, SOB, wheezing, chest pain, nausea, vomiting, changes in bowel or bladder habits.    ROS: As per HPI.  All other pertinent ROS negative.     Past Medical History:  Diagnosis Date  . Bronchitis    Past Surgical History:  Procedure Laterality Date  . ABDOMINAL HYSTERECTOMY     1987  . BREAST REDUCTION SURGERY  2004  . tummy tuck  2008   No Known Allergies No current facility-administered medications on file prior to encounter.   Current Outpatient Medications on File Prior to Encounter  Medication Sig Dispense Refill  . [DISCONTINUED] metFORMIN (GLUCOPHAGE) 500 MG tablet Take 500 mg by mouth daily.     Social History   Socioeconomic History  . Marital status: Single    Spouse name: Not on file  . Number of children: Not on file  . Years of education: Not on file  . Highest education level: Not on file  Occupational History  . Not on file  Tobacco Use  . Smoking status: Never Smoker  . Smokeless tobacco: Never Used  Substance and Sexual Activity  . Alcohol use: Yes    Comment: 2 x week  . Drug use: Never  . Sexual activity: Not on file  Other Topics Concern  . Not on file  Social History Narrative   Lives with mother.  Works for [employer].  Education college 2 yrs.  Divorced.  No children.  Caffeine 12-16 oz daily.   Social Determinants of Health   Financial Resource Strain:   . Difficulty of Paying Living Expenses: Not on file  Food Insecurity:   . Worried About Charity fundraiser in the Last  Year: Not on file  . Ran Out of Food in the Last Year: Not on file  Transportation Needs:   . Lack of Transportation (Medical): Not on file  . Lack of Transportation (Non-Medical): Not on file  Physical Activity:   . Days of Exercise per Week: Not on file  . Minutes of Exercise per Session: Not on file  Stress:   . Feeling of Stress : Not on file  Social Connections:   . Frequency of Communication with Friends and Family: Not on file  . Frequency of Social Gatherings with Friends and Family: Not on file  . Attends Religious Services: Not on file  . Active Member of Clubs or Organizations: Not on file  . Attends Archivist Meetings: Not on file  . Marital Status: Not on file  Intimate Partner Violence:   . Fear of Current or Ex-Partner: Not on file  . Emotionally Abused: Not on file  . Physically Abused: Not on file  . Sexually Abused: Not on file   Family History  Problem Relation Age of Onset  . Hypertension Mother   . Heart disease Mother   . Hypertension Sister   . Diabetes Brother   . Hypertension Brother   . Heart disease Maternal Grandmother   . Alzheimer's disease Other     OBJECTIVE:  Vitals:   07/21/19 0842  BP: (!) 141/85  Pulse: 90  Resp: 18  Temp: 98.9 F (37.2 C)  TempSrc: Oral  SpO2: 97%     General appearance: alert; appears fatigued, but nontoxic; speaking in full sentences and tolerating own secretions HEENT: NCAT; Ears: EACs clear, TMs pearly gray; Eyes: PERRL.  EOM grossly intact.  Nose: nares patent without rhinorrhea, Throat: oropharynx clear, tonsils non erythematous or enlarged, uvula midline  Neck: supple without LAD Lungs: unlabored respirations, symmetrical air entry; cough: mild; no respiratory distress; CTAB Heart: regular rate and rhythm.   Skin: warm and dry Psychological: alert and cooperative; normal mood and affect  ASSESSMENT & PLAN:  1. Suspected COVID-19 virus infection   2. Viral URI with cough     Meds  ordered this encounter  Medications  . cetirizine (ZYRTEC) 10 MG tablet    Sig: Take 1 tablet (10 mg total) by mouth daily.    Dispense:  30 tablet    Refill:  0    Order Specific Question:   Supervising Provider    Answer:   Raylene Everts [id number]  . fluticasone (FLONASE) 50 MCG/ACT nasal spray    Sig: Place 2 sprays into both nostrils daily.    Dispense:  16 g    Refill:  0    Order Specific Question:   Supervising Provider    Answer:   Raylene Everts [id number]  . benzonatate (TESSALON) 100 MG capsule    Sig: Take 1 capsule (100 mg total) by mouth every 8 (eight) hours.    Dispense:  21 capsule    Refill:  0    Order Specific Question:   Supervising Provider    Answer:   Raylene Everts [id number]   COVID testing ordered.  It will take between 5-7 days for test results.  Someone will contact you regarding abnormal results.    In the meantime: You should remain isolated in your home for 10 days from symptom onset AND greater than 72 hours after symptoms resolution (absence of fever without the use of fever-reducing medication and improvement in respiratory symptoms), whichever is longer Get plenty of rest and push fluids Tessalon Perles prescribed for cough Use OTC zyrtec for nasal congestion, runny nose, and/or sore throat Use OTC flonase for nasal congestion and runny nose Use medications daily for symptom relief Use OTC medications like ibuprofen or tylenol as needed fever or pain Call or go to the ED if you have any new or worsening symptoms such as fever, worsening cough, shortness of breath, chest tightness, chest pain, turning blue, changes in mental status, etc...   Reviewed expectations re: course of current medical issues. Questions answered. Outlined signs and symptoms indicating need for more acute intervention. Patient verbalized understanding. After Visit Summary given.         Lestine Box, PA-C 07/21/19 [phone]

## 2019-07-21 NOTE — ED Triage Notes (Signed)
Pt presents to UC w/ c/o productive cough, and nasal drainge x2 days.

## 2019-07-22 LAB — NOVEL CORONAVIRUS, NAA: SARS-CoV-2, NAA: DETECTED — AB

## 2019-07-23 ENCOUNTER — Telehealth: Payer: Self-pay | Admitting: Nurse Practitioner

## 2019-07-23 ENCOUNTER — Encounter (HOSPITAL_COMMUNITY): Payer: Self-pay

## 2019-07-23 ENCOUNTER — Telehealth (HOSPITAL_COMMUNITY): Payer: Self-pay | Admitting: Emergency Medicine

## 2019-07-23 NOTE — Telephone Encounter (Signed)
Called to Discuss with patient about Covid symptoms and the use of bamlanivimab, a monoclonal antibody infusion for those with mild to moderate Covid symptoms and at a high risk of hospitalization.     Pt is qualified for this infusion at the [facility] due to co-morbid conditions and/or a member of an at-risk group.     Unable to reach pt  

## 2019-07-23 NOTE — Telephone Encounter (Signed)

## 2019-07-29 ENCOUNTER — Encounter: Payer: Self-pay | Admitting: Pulmonary Disease

## 2019-07-29 ENCOUNTER — Telehealth: Payer: Self-pay | Admitting: Pulmonary Disease

## 2019-07-29 ENCOUNTER — Other Ambulatory Visit: Payer: Self-pay

## 2019-07-29 ENCOUNTER — Ambulatory Visit
Admission: EM | Admit: 2019-07-29 | Discharge: 2019-07-29 | Disposition: A | Discharge status: Home / Self Care | Payer: BC Managed Care – PPO | Attending: Emergency Medicine | Admitting: Emergency Medicine

## 2019-07-29 ENCOUNTER — Ambulatory Visit (INDEPENDENT_AMBULATORY_CARE_PROVIDER_SITE_OTHER): Payer: BC Managed Care – PPO

## 2019-07-29 ENCOUNTER — Telehealth: Payer: Self-pay | Admitting: Emergency Medicine

## 2019-07-29 ENCOUNTER — Telehealth: Payer: Self-pay

## 2019-07-29 DIAGNOSIS — R05 Cough: Secondary | ICD-10-CM | POA: Diagnosis not present

## 2019-07-29 DIAGNOSIS — U071 COVID-19: Secondary | ICD-10-CM

## 2019-07-29 DIAGNOSIS — R0602 Shortness of breath: Secondary | ICD-10-CM | POA: Diagnosis not present

## 2019-07-29 DIAGNOSIS — R059 Cough, unspecified: Secondary | ICD-10-CM

## 2019-07-29 MED ORDER — PREDNISONE 20 MG PO TABS: 20.0000 mg | ORAL_TABLET | Freq: Two times a day (BID) | ORAL | 0 refills | Status: AC

## 2019-07-29 MED ORDER — ONE FLOW SPIROMETER KIT: PACK | 0 refills | Status: DC

## 2019-07-29 MED ORDER — ALBUTEROL SULFATE HFA 108 (90 BASE) MCG/ACT IN AERS: 1.0000 | INHALATION_SPRAY | Freq: Four times a day (QID) | RESPIRATORY_TRACT | 0 refills | Status: DC | PRN

## 2019-07-29 NOTE — Discharge Instructions (Addendum)
COVID test was positive Chest x-ray negative for cardiopulmonary disease You should continue to remain isolated in your home for 10 days from symptom onset AND greater than 72 hours after symptoms resolution (absence of fever without the use of fever-reducing medication and improvement in respiratory symptoms), whichever is longer Get plenty of rest and push fluids Albuterol inhaler prescribed.  Use as needed for cough and shortness of breath Prednisone prescribed.  Take as directed and to completion Use OTC zyrtec for nasal congestion, runny nose, and/or sore throat Use OTC flonase for nasal congestion and runny nose Use medications daily for symptom relief Use OTC medications like ibuprofen or tylenol as needed fever or pain Call or go to the ED if you have any new or worsening symptoms such as fever, worsening cough, shortness of breath, chest tightness, chest pain, turning blue, changes in mental status, etc..Marland Kitchen

## 2019-07-29 NOTE — Telephone Encounter (Signed)
07/29/19 1239  I was able to connect with the patient via the telephone.  She was referred from urgent care.  Patient tested positive for Covid on 07/21/2019.  Patient had 2 to 3 days of symptoms prior to testing positive.  Patient was attempted to be reached by our team on 07/23/2019.  Unfortunately they were unable to reach the patient.  Today would be day 10 of patient's symptoms.  This means the patient would not be a candidate for the monoclonal antibody infusion.  Patient was evaluated again today at urgent care.  She was treated with prednisone.  Chest x-ray was clear.  Patient has yet to follow-up or update her primary care regarding her course of COVID-19.  She reports she will do that today.  I have added Ms. Drucilla Schmidt MD to patient's chart as primary care provider.  Patient continues to have ongoing cough, fatigue, congestion.  I have encouraged the patient to take the prednisone as outlined by urgent care.  I have also encouraged the patient to utilize her MyChart video visit if she feels she needs additional evaluation.  Patient knows that if symptoms worsen such as increased shortness of breath, chest pain, etc. for her to seek emergent evaluation at an emergency room or urgent care.  I will route this message as FYI to patient's primary care provider as well as urgent care provider the patient's all today.  Wyn Quaker, FNP

## 2019-07-29 NOTE — ED Triage Notes (Signed)
Pt presents to UC w/ c/o persistent worsening productive cough since last visit.  Pt would like to have lungs listened to, possibly a chest xray, and a medication to help w/ cough.

## 2019-07-29 NOTE — Telephone Encounter (Signed)
PT requests spirometer to assist with breathing; dx'ed with covid

## 2019-07-29 NOTE — ED Provider Notes (Addendum)
[place]   [id number] 07/29/19 Arrival Time: [id number]   CC: COVID infection; persistent cough  SUBJECTIVE: History from: patient.  Jane Heath is a 59 y.o. female who presents with worsening productive cough with yellow/white sputum, and SOB x 1 week.  Dx'ed with COVID 1 week ago.  Has tried zyrtec, flonase, and tessalon without relief.  Symptoms are made worse with deep breath. Denies known previous COVID Infection.  Complains of fatigue, and nose bleeds. Denies  sinus pain, rhinorrhea, sore throat, wheezing, chest pain, nausea, changes in bowel or bladder habits.     ROS: As per HPI.  All other pertinent ROS negative.     Past Medical History:  Diagnosis Date  . Bronchitis    Past Surgical History:  Procedure Laterality Date  . ABDOMINAL HYSTERECTOMY     1987  . BREAST REDUCTION SURGERY  2004  . tummy tuck  2008   No Known Allergies No current facility-administered medications on file prior to encounter.   Current Outpatient Medications on File Prior to Encounter  Medication Sig Dispense Refill  . benzonatate (TESSALON) 100 MG capsule Take 1 capsule (100 mg total) by mouth every 8 (eight) hours. 21 capsule 0  . cetirizine (ZYRTEC) 10 MG tablet Take 1 tablet (10 mg total) by mouth daily. 30 tablet 0  . fluticasone (FLONASE) 50 MCG/ACT nasal spray Place 2 sprays into both nostrils daily. 16 g 0  . [DISCONTINUED] metFORMIN (GLUCOPHAGE) 500 MG tablet Take 500 mg by mouth daily.     Social History   Socioeconomic History  . Marital status: Single    Spouse name: Not on file  . Number of children: Not on file  . Years of education: Not on file  . Highest education level: Not on file  Occupational History  . Not on file  Tobacco Use  . Smoking status: Never Smoker  . Smokeless tobacco: Never Used  Substance and Sexual Activity  . Alcohol use: Yes    Comment: 2 x week  . Drug use: Never  . Sexual activity: Not on file  Other Topics Concern  . Not on  file  Social History Narrative   Lives with mother.  Works for [employer].  Education college 2 yrs.  Divorced.  No children.  Caffeine 12-16 oz daily.   Social Determinants of Health   Financial Resource Strain:   . Difficulty of Paying Living Expenses: Not on file  Food Insecurity:   . Worried About Charity fundraiser in the Last Year: Not on file  . Ran Out of Food in the Last Year: Not on file  Transportation Needs:   . Lack of Transportation (Medical): Not on file  . Lack of Transportation (Non-Medical): Not on file  Physical Activity:   . Days of Exercise per Week: Not on file  . Minutes of Exercise per Session: Not on file  Stress:   . Feeling of Stress : Not on file  Social Connections:   . Frequency of Communication with Friends and Family: Not on file  . Frequency of Social Gatherings with Friends and Family: Not on file  . Attends Religious Services: Not on file  . Active Member of Clubs or Organizations: Not on file  . Attends Archivist Meetings: Not on file  . Marital Status: Not on file  Intimate Partner Violence:   . Fear of Current or Ex-Partner: Not on file  . Emotionally Abused: Not on file  . Physically Abused: Not  on file  . Sexually Abused: Not on file   Family History  Problem Relation Age of Onset  . Hypertension Mother   . Heart disease Mother   . Hypertension Sister   . Diabetes Brother   . Hypertension Brother   . Heart disease Maternal Grandmother   . Alzheimer's disease Other     OBJECTIVE:  Vitals:   07/29/19 0959  BP: 126/82  Pulse: 79  Resp: 16  Temp: 99 F (37.2 C)  TempSrc: Oral  SpO2: 95%     General appearance: alert; appears fatigued, but nontoxic; speaking in full sentences and tolerating own secretions HEENT: NCAT; Ears: EACs clear, TMs pearly gray; Eyes: PERRL.  EOM grossly intact. Nose: nares patent without rhinorrhea, Throat: oropharynx clear, tonsils non erythematous or enlarged, uvula midline  Neck: supple  without LAD Lungs: unlabored respirations, symmetrical air entry; cough: moderate; no respiratory distress; CTAB Heart: regular rate and rhythm.   Skin: warm and dry Psychological: alert and cooperative; normal mood and affect  DIAGNOSTIC STUDIES:  DG Chest 2 View  Result Date: 07/29/2019 CLINICAL DATA:  Shortness of breath, cough. EXAM: CHEST - 2 VIEW COMPARISON:  None. FINDINGS: The heart size and mediastinal contours are within normal limits. Both lungs are clear. No pneumothorax or pleural effusion is noted. The visualized skeletal structures are unremarkable. IMPRESSION: No active cardiopulmonary disease. Electronically Signed   By: Marijo Conception M.D.   On: 07/29/2019 10:22    X-rays negative for cardiopulmonary disease  I have reviewed the x-rays myself and the radiologist interpretation. I am in agreement with the radiologist interpretation.     ASSESSMENT & PLAN:  1. COVID-19 virus infection   2. Cough     Meds ordered this encounter  Medications  . albuterol (VENTOLIN HFA) 108 (90 Base) MCG/ACT inhaler    Sig: Inhale 1-2 puffs into the lungs every 6 (six) hours as needed for wheezing or shortness of breath.    Dispense:  18 g    Refill:  0    Order Specific Question:   Supervising Provider    Answer:   Raylene Everts [id number]  . predniSONE (DELTASONE) 20 MG tablet    Sig: Take 1 tablet (20 mg total) by mouth 2 (two) times daily with a meal for 5 days.    Dispense:  10 tablet    Refill:  0    Order Specific Question:   Supervising Provider    Answer:   Raylene Everts [id number]   COVID test was positive Chest x-ray negative for cardiopulmonary disease You should continue to remain isolated in your home for 10 days from symptom onset AND greater than 72 hours after symptoms resolution (absence of fever without the use of fever-reducing medication and improvement in respiratory symptoms), whichever is longer Get plenty of rest and push fluids Albuterol inhaler  prescribed.  Use as needed for cough and shortness of breath Prednisone prescribed.  Take as directed and to completion Use OTC zyrtec for nasal congestion, runny nose, and/or sore throat Use OTC flonase for nasal congestion and runny nose Use medications daily for symptom relief Use OTC medications like ibuprofen or tylenol as needed fever or pain Call or go to the ED if you have any new or worsening symptoms such as fever, worsening cough, shortness of breath, chest tightness, chest pain, turning blue, changes in mental status, etc...   Infusion center called with patients info as well  Reviewed expectations re: course of current medical  issues. Questions answered. Outlined signs and symptoms indicating need for more acute intervention. Patient verbalized understanding. After Visit Summary given.     Lestine Box, PA-C 07/29/19 1042

## 2019-07-30 ENCOUNTER — Telehealth: Payer: Self-pay | Admitting: Emergency Medicine

## 2019-07-30 MED ORDER — ONE FLOW SPIROMETER KIT: PACK | 0 refills | Status: DC

## 2019-07-30 NOTE — Telephone Encounter (Signed)
Paper RX printed for patient per pharmacy

## 2019-08-28 DIAGNOSIS — G4733 Obstructive sleep apnea (adult) (pediatric): Secondary | ICD-10-CM | POA: Diagnosis not present

## 2019-09-04 DIAGNOSIS — L2089 Other atopic dermatitis: Secondary | ICD-10-CM | POA: Diagnosis not present

## 2019-09-04 DIAGNOSIS — T887XXA Unspecified adverse effect of drug or medicament, initial encounter: Secondary | ICD-10-CM | POA: Diagnosis not present

## 2019-09-04 DIAGNOSIS — Z8616 Personal history of COVID-19: Secondary | ICD-10-CM | POA: Diagnosis not present

## 2019-09-04 DIAGNOSIS — L853 Xerosis cutis: Secondary | ICD-10-CM | POA: Diagnosis not present

## 2019-09-06 DIAGNOSIS — T887XXA Unspecified adverse effect of drug or medicament, initial encounter: Secondary | ICD-10-CM | POA: Diagnosis not present

## 2019-09-10 ENCOUNTER — Ambulatory Visit: Payer: BC Managed Care – PPO | Attending: Internal Medicine

## 2019-09-10 DIAGNOSIS — Z23 Encounter for immunization: Secondary | ICD-10-CM | POA: Insufficient documentation

## 2019-09-10 NOTE — Progress Notes (Signed)
   Covid-19 Vaccination Clinic  Name:  Jane Heath    MRN: [id number] DOB: [birth year]  09/10/2019  [name] was observed post Covid-19 immunization for 15 minutes without incident. She was provided with Vaccine Information Sheet and instruction to access the V-Safe system.   [name] was instructed to call 911 with any severe reactions post vaccine: Marland Kitchen Difficulty breathing  . Swelling of face and throat  . A fast heartbeat  . A bad rash all over body  . Dizziness and weakness   Immunizations Administered    Name Date Dose VIS Date Route   Moderna COVID-19 Vaccine 09/10/2019  4:04 PM 0.5 mL 06/11/2019 Intramuscular   Manufacturer: Moderna   Lot: [id number]   [id number]

## 2019-10-03 ENCOUNTER — Other Ambulatory Visit: Payer: Self-pay

## 2019-10-03 ENCOUNTER — Ambulatory Visit
Admission: EM | Admit: 2019-10-03 | Discharge: 2019-10-03 | Disposition: A | Discharge status: Home / Self Care | Payer: BC Managed Care – PPO | Attending: Emergency Medicine | Admitting: Emergency Medicine

## 2019-10-03 DIAGNOSIS — J069 Acute upper respiratory infection, unspecified: Secondary | ICD-10-CM | POA: Diagnosis not present

## 2019-10-03 MED ORDER — PREDNISONE 20 MG PO TABS: 20.0000 mg | ORAL_TABLET | Freq: Two times a day (BID) | ORAL | 0 refills | Status: AC

## 2019-10-03 MED ORDER — HYDROCODONE-HOMATROPINE 5-1.5 MG/5ML PO SYRP: 5.0000 mL | ORAL_SOLUTION | Freq: Every day | ORAL | 0 refills | Status: DC

## 2019-10-03 MED ORDER — CETIRIZINE HCL 10 MG PO TABS: 10.0000 mg | ORAL_TABLET | Freq: Every day | ORAL | 0 refills | Status: DC

## 2019-10-03 MED ORDER — FLUTICASONE PROPIONATE 50 MCG/ACT NA SUSP: 2.0000 | Freq: Every day | NASAL | 0 refills | Status: DC

## 2019-10-03 MED ORDER — BENZONATATE 100 MG PO CAPS: 100.0000 mg | ORAL_CAPSULE | Freq: Three times a day (TID) | ORAL | 0 refills | Status: DC

## 2019-10-03 NOTE — Discharge Instructions (Signed)
Get plenty of rest and push fluids Prednisone prescribed.  Take as directed and to completion Prescribed tessolone perles as needed for cough Zyrtec, flonase for runny nose and congestion Hycodan for severe break-through cough while sleeping.  DO NOT TAKE prior to driving or operating heavy machinery as this may make you drowsy Follow up with PCP as needed Return or go to ER if you have any new or worsening symptoms such as fever, chills, fatigue, shortness of breath, wheezing, chest pain, nausea, changes in bowel or bladder habits, etc..Marland Kitchen

## 2019-10-03 NOTE — ED Triage Notes (Signed)
Pt presents with c/o nonproductive  cough that began on Monday

## 2019-10-03 NOTE — ED Provider Notes (Signed)
[place]   [id number] 10/03/19 Arrival Time: 1616  Cc: COUGH  SUBJECTIVE:  Jane Heath is a 59 y.o. female who presents with persistent dry cough, runny nose, congestion, sinus HA, and PND x 5 days.  Denies positive sick exposure or precipitating event.  Has tried OTC medications without relief.  Symptoms are made worse with laying down.  Reports previous symptoms in the past that improved with steroid.   Denies fever, chills, SOB, wheezing, chest pain, nausea, vomiting, changes in bowel or bladder habits.    Patient tested positive for COVID in January of 2021  ROS: As per HPI.  All other pertinent ROS negative.     Past Medical History:  Diagnosis Date  . Bronchitis    Past Surgical History:  Procedure Laterality Date  . ABDOMINAL HYSTERECTOMY     1987  . BREAST REDUCTION SURGERY  2004  . tummy tuck  2008   No Known Allergies No current facility-administered medications on file prior to encounter.   Current Outpatient Medications on File Prior to Encounter  Medication Sig Dispense Refill  . albuterol (VENTOLIN HFA) 108 (90 Base) MCG/ACT inhaler Inhale 1-2 puffs into the lungs every 6 (six) hours as needed for wheezing or shortness of breath. 18 g 0  . Respiratory Therapy Supplies (ONE FLOW SPIROMETER) KIT Use as directed 1 kit 0  . [DISCONTINUED] metFORMIN (GLUCOPHAGE) 500 MG tablet Take 500 mg by mouth daily.      Social History   Socioeconomic History  . Marital status: Single    Spouse name: Not on file  . Number of children: Not on file  . Years of education: Not on file  . Highest education level: Not on file  Occupational History  . Not on file  Tobacco Use  . Smoking status: Never Smoker  . Smokeless tobacco: Never Used  Substance and Sexual Activity  . Alcohol use: Yes    Comment: 2 x week  . Drug use: Never  . Sexual activity: Not on file  Other Topics Concern  . Not on file  Social History Narrative   Lives with mother.  Works for  [employer].  Education college 2 yrs.  Divorced.  No children.  Caffeine 12-16 oz daily.   Social Determinants of Health   Financial Resource Strain:   . Difficulty of Paying Living Expenses:   Food Insecurity:   . Worried About Charity fundraiser in the Last Year:   . Arboriculturist in the Last Year:   Transportation Needs:   . Film/video editor (Medical):   Marland Kitchen Lack of Transportation (Non-Medical):   Physical Activity:   . Days of Exercise per Week:   . Minutes of Exercise per Session:   Stress:   . Feeling of Stress :   Social Connections:   . Frequency of Communication with Friends and Family:   . Frequency of Social Gatherings with Friends and Family:   . Attends Religious Services:   . Active Member of Clubs or Organizations:   . Attends Archivist Meetings:   Marland Kitchen Marital Status:   Intimate Partner Violence:   . Fear of Current or Ex-Partner:   . Emotionally Abused:   Marland Kitchen Physically Abused:   . Sexually Abused:    Family History  Problem Relation Age of Onset  . Hypertension Mother   . Heart disease Mother   . Hypertension Sister   . Diabetes Brother   . Hypertension Brother   .  Heart disease Maternal Grandmother   . Alzheimer's disease Other      OBJECTIVE:  Vitals:   10/03/19 1628  BP: 137/84  Pulse: 84  Resp: 20  Temp: 98.6 F (37 C)  SpO2: 98%     General appearance: Alert, appears mildly fatigued, but nontoxic; speaking in full sentences without difficulty HEENT:NCAT; Ears: EACs clear, TMs pearly gray; Eyes: PERRL.  EOM grossly intact.  Sinuses: NTTP; Nose: nares patent without rhinorrhea, turbinates swollen and erythematous; Throat: tonsils nonerythematous or enlarged, uvula midline  Neck: supple without LAD Lungs: clear to auscultation bilaterally without adventitious breath sounds; normal respiratory effort; mild to moderate cough present Heart: regular rate and rhythm.  Skin: warm and dry Psychological: alert and cooperative; normal  mood and affect  ASSESSMENT & PLAN:  1. Viral URI with cough     Meds ordered this encounter  Medications  . benzonatate (TESSALON) 100 MG capsule    Sig: Take 1 capsule (100 mg total) by mouth every 8 (eight) hours.    Dispense:  21 capsule    Refill:  0    Order Specific Question:   Supervising Provider    Answer:   Raylene Everts [[id number]]  . fluticasone (FLONASE) 50 MCG/ACT nasal spray    Sig: Place 2 sprays into both nostrils daily.    Dispense:  16 g    Refill:  0    Order Specific Question:   Supervising Provider    Answer:   Raylene Everts [[id number]]  . cetirizine (ZYRTEC) 10 MG tablet    Sig: Take 1 tablet (10 mg total) by mouth daily.    Dispense:  30 tablet    Refill:  0    Order Specific Question:   Supervising Provider    Answer:   Raylene Everts [[id number]]  . predniSONE (DELTASONE) 20 MG tablet    Sig: Take 1 tablet (20 mg total) by mouth 2 (two) times daily with a meal for 5 days.    Dispense:  10 tablet    Refill:  0    Order Specific Question:   Supervising Provider    Answer:   Raylene Everts [[id number]]  . HYDROcodone-homatropine (HYCODAN) 5-1.5 MG/5ML syrup    Sig: Take 5 mLs by mouth at bedtime.    Dispense:  30 mL    Refill:  0    Order Specific Question:   Supervising Provider    Answer:   Raylene Everts [[id number]]   Get plenty of rest and push fluids Prednisone prescribed.  Take as directed and to completion Prescribed tessolone perles as needed for cough Zyrtec, flonase for runny nose and congestion Hycodan for severe break-through cough while sleeping.  DO NOT TAKE prior to driving or operating heavy machinery as this may make you drowsy Follow up with PCP as needed Return or go to ER if you have any new or worsening symptoms such as fever, chills, fatigue, shortness of breath, wheezing, chest pain, nausea, changes in bowel or bladder habits, etc...  Reviewed expectations re: course of current medical issues. Questions  answered. Outlined signs and symptoms indicating need for more acute intervention. Patient verbalized understanding. After Visit Summary given.          Lestine Box, PA-C 10/03/19 1645

## 2019-10-04 ENCOUNTER — Telehealth: Payer: Self-pay

## 2019-10-04 MED ORDER — HYDROCODONE-HOMATROPINE 5-1.5 MG/5ML PO SYRP: 5.0000 mL | ORAL_SOLUTION | Freq: Every evening | ORAL | 0 refills | Status: DC

## 2019-10-04 NOTE — Telephone Encounter (Addendum)
Patient called as she was unable to get her medication.  [facility] pharmacy was called for confirmation and the medication was not received.  Medication was then resent.

## 2019-10-08 ENCOUNTER — Ambulatory Visit: Payer: BC Managed Care – PPO | Attending: Internal Medicine

## 2019-10-08 DIAGNOSIS — Z23 Encounter for immunization: Secondary | ICD-10-CM

## 2019-10-08 NOTE — Progress Notes (Signed)
   Covid-19 Vaccination Clinic  Name:  Jane Heath    MRN: [id number] DOB: [birth year]  10/08/2019  [name] was observed post Covid-19 immunization for 15 minutes without incident. She was provided with Vaccine Information Sheet and instruction to access the V-Safe system.   [name] was instructed to call 911 with any severe reactions post vaccine: Marland Kitchen Difficulty breathing  . Swelling of face and throat  . A fast heartbeat  . A bad rash all over body  . Dizziness and weakness   Immunizations Administered    Name Date Dose VIS Date Route   Moderna COVID-19 Vaccine 10/08/2019  4:14 PM 0.5 mL 06/11/2019 Intramuscular   Manufacturer: Moderna   Lot: [id number]   [id number]

## 2019-11-18 ENCOUNTER — Ambulatory Visit: Payer: Self-pay | Admitting: Emergency Medicine

## 2019-11-19 ENCOUNTER — Encounter: Payer: Self-pay | Admitting: Emergency Medicine

## 2019-11-27 ENCOUNTER — Ambulatory Visit: Payer: BC Managed Care – PPO | Admitting: Emergency Medicine

## 2019-11-28 ENCOUNTER — Encounter: Payer: Self-pay | Admitting: Emergency Medicine

## 2019-12-05 DIAGNOSIS — M79604 Pain in right leg: Secondary | ICD-10-CM | POA: Diagnosis not present

## 2019-12-05 DIAGNOSIS — M25561 Pain in right knee: Secondary | ICD-10-CM | POA: Diagnosis not present

## 2019-12-19 ENCOUNTER — Encounter (HOSPITAL_COMMUNITY): Payer: Self-pay | Admitting: *Deleted

## 2019-12-19 ENCOUNTER — Emergency Department (HOSPITAL_COMMUNITY)
Admission: EM | Admit: 2019-12-19 | Discharge: 2019-12-19 | Disposition: A | Discharge status: Home / Self Care | Payer: BC Managed Care – PPO | Attending: Emergency Medicine | Admitting: Emergency Medicine

## 2019-12-19 ENCOUNTER — Emergency Department (HOSPITAL_COMMUNITY): Payer: BC Managed Care – PPO

## 2019-12-19 ENCOUNTER — Other Ambulatory Visit: Payer: Self-pay

## 2019-12-19 DIAGNOSIS — R569 Unspecified convulsions: Secondary | ICD-10-CM | POA: Insufficient documentation

## 2019-12-19 DIAGNOSIS — R519 Headache, unspecified: Secondary | ICD-10-CM | POA: Diagnosis not present

## 2019-12-19 DIAGNOSIS — R197 Diarrhea, unspecified: Secondary | ICD-10-CM | POA: Insufficient documentation

## 2019-12-19 DIAGNOSIS — R000 Tachycardia, unspecified: Secondary | ICD-10-CM | POA: Diagnosis not present

## 2019-12-19 DIAGNOSIS — R404 Transient alteration of awareness: Secondary | ICD-10-CM | POA: Diagnosis not present

## 2019-12-19 DIAGNOSIS — R4020 Unspecified coma: Secondary | ICD-10-CM | POA: Diagnosis not present

## 2019-12-19 DIAGNOSIS — R55 Syncope and collapse: Secondary | ICD-10-CM | POA: Diagnosis not present

## 2019-12-19 DIAGNOSIS — R1030 Lower abdominal pain, unspecified: Secondary | ICD-10-CM | POA: Diagnosis not present

## 2019-12-19 DIAGNOSIS — R52 Pain, unspecified: Secondary | ICD-10-CM | POA: Diagnosis not present

## 2019-12-19 LAB — COMPREHENSIVE METABOLIC PANEL
ALT: 20 U/L (ref 0–44)
AST: 30 U/L (ref 15–41)
Albumin: 4.5 g/dL (ref 3.5–5.0)
Alkaline Phosphatase: 48 U/L (ref 38–126)
Anion gap: 13 (ref 5–15)
BUN: 15 mg/dL (ref 6–20)
CO2: 22 mmol/L (ref 22–32)
Calcium: 9.5 mg/dL (ref 8.9–10.3)
Chloride: 101 mmol/L (ref 98–111)
Creatinine, Ser: 0.85 mg/dL (ref 0.44–1.00)
GFR calc Af Amer: >60 mL/min (ref >60)
GFR calc non Af Amer: >60 mL/min (ref >60)
Glucose, Bld: 99 mg/dL (ref 70–99)
Potassium: 3.8 mmol/L (ref 3.5–5.1)
Sodium: 136 mmol/L (ref 135–145)
Total Bilirubin: 0.4 mg/dL (ref 0.3–1.2)
Total Protein: 7 g/dL (ref 6.5–8.1)

## 2019-12-19 LAB — CBC WITH DIFFERENTIAL/PLATELET
Abs Immature Granulocytes: 0.02 10*3/uL (ref 0.00–0.07)
Basophils Absolute: 0.1 10*3/uL (ref 0.0–0.1)
Basophils Relative: 1 %
Eosinophils Absolute: 0.1 10*3/uL (ref 0.0–0.5)
Eosinophils Relative: 2 %
HCT: 37.1 % (ref 36.0–46.0)
Hemoglobin: 12 g/dL (ref 12.0–15.0)
Immature Granulocytes: 0 %
Lymphocytes Relative: 23 %
Lymphs Abs: 1.8 10*3/uL (ref 0.7–4.0)
MCH: 28.8 pg (ref 26.0–34.0)
MCHC: 32.3 g/dL (ref 30.0–36.0)
MCV: 89 fL (ref 80.0–100.0)
Monocytes Absolute: 0.5 10*3/uL (ref 0.1–1.0)
Monocytes Relative: 7 %
Neutro Abs: 5.4 10*3/uL (ref 1.7–7.7)
Neutrophils Relative %: 67 %
Platelets: 273 10*3/uL (ref 150–400)
RBC: 4.17 MIL/uL (ref 3.87–5.11)
RDW: 12.7 % (ref 11.5–15.5)
WBC: 7.9 10*3/uL (ref 4.0–10.5)
nRBC: 0 % (ref 0.0–0.2)

## 2019-12-19 LAB — URINALYSIS, ROUTINE W REFLEX MICROSCOPIC
Bilirubin Urine: NEGATIVE
Glucose, UA: NEGATIVE mg/dL
Hgb urine dipstick: NEGATIVE
Ketones, ur: NEGATIVE mg/dL
Leukocytes,Ua: NEGATIVE
Nitrite: NEGATIVE
Protein, ur: NEGATIVE mg/dL
Specific Gravity, Urine: 1.003 — ABNORMAL LOW (ref 1.005–1.030)
pH: 7 (ref 5.0–8.0)

## 2019-12-19 LAB — LIPASE, BLOOD: Lipase: 26 U/L (ref 11–51)

## 2019-12-19 LAB — PHOSPHORUS: Phosphorus: 2.7 mg/dL (ref 2.5–4.6)

## 2019-12-19 LAB — RAPID URINE DRUG SCREEN, HOSP PERFORMED
Amphetamines: NOT DETECTED
Barbiturates: NOT DETECTED
Benzodiazepines: NOT DETECTED
Cocaine: NOT DETECTED
Opiates: NOT DETECTED
Tetrahydrocannabinol: NOT DETECTED

## 2019-12-19 LAB — ETHANOL: Alcohol, Ethyl (B): <10 mg/dL (ref <10)

## 2019-12-19 LAB — MAGNESIUM: Magnesium: 1.8 mg/dL (ref 1.7–2.4)

## 2019-12-19 LAB — CBG MONITORING, ED: Glucose-Capillary: 87 mg/dL (ref 70–99)

## 2019-12-19 MED ORDER — SODIUM CHLORIDE 0.9 % IV BOLUS
1000.0000 mL | Freq: Once | INTRAVENOUS | Status: AC
  Administered 2019-12-19: 1000 mL via INTRAVENOUS

## 2019-12-19 MED ORDER — SODIUM CHLORIDE 0.9 % IV SOLN: INTRAVENOUS | Status: DC

## 2019-12-19 NOTE — ED Provider Notes (Signed)
[facility] EMERGENCY DEPARTMENT Provider Note   CSN: [id number] Arrival date & time: 12/19/19  1246     History Chief Complaint  Patient presents with  . Seizures    Jane Heath is a 59 y.o. female.  HPI      Jane Heath is a 59 y.o. female who presents to the Emergency Department for evaluation of possible seizure earlier today.  patient works third shift, came home, ate pizza and worked on her computer for awhile before lying down.  She doesn't recall what happened after that.  patient's mother states that she heard a noise in her room and she found patient lying on bed, unresponsive.  She noticed that she had a small amount of blood coming from right side of her mouth.  Mother shook her and patient opened her eyes and looked at her, but did not speak.  Duration of symptoms is unknown.  Here, patient reports feeling sleepy and generally weak with crampy pain across her lower abdomen and "tingling" in both legs.  She endorses having intermittent abdominal pain and leg tingling prior to today.  No history of seizures, fever, chills, shortness of breath, chest pain, headache, dizziness or visual changes.  No reported fall or head injury. She also denies drugs, alcohol use.  No recent illness  Or new medications.  She does admit to increased stressors. Per nursing, upon arrival here pt has had two large BM's.     Past Medical History:  Diagnosis Date  . Bronchitis     Patient Active Problem List   Diagnosis Date Noted  . Flu-like symptoms 06/06/2017  . Influenza due to identified novel influenza A virus with other respiratory manifestations 06/06/2017  . Laryngitis 06/06/2017    Past Surgical History:  Procedure Laterality Date  . ABDOMINAL HYSTERECTOMY     1987  . BREAST REDUCTION SURGERY  2004  . tummy tuck  2008     OB History   No obstetric history on file.     Family History  Problem Relation Age of Onset  . Hypertension Mother   . Heart disease  Mother   . Hypertension Sister   . Diabetes Brother   . Hypertension Brother   . Heart disease Maternal Grandmother   . Alzheimer's disease Other     Social History   Tobacco Use  . Smoking status: Never Smoker  . Smokeless tobacco: Never Used  Substance Use Topics  . Alcohol use: Yes    Comment: 2 x week  . Drug use: Never    Home Medications Prior to Admission medications   Medication Sig Start Date End Date Taking? Authorizing Provider  albuterol (VENTOLIN HFA) 108 (90 Base) MCG/ACT inhaler Inhale 1-2 puffs into the lungs every 6 (six) hours as needed for wheezing or shortness of breath. 07/29/19   Wurst, [place], PA-C  benzonatate (TESSALON) 100 MG capsule Take 1 capsule (100 mg total) by mouth every 8 (eight) hours. 10/03/19   Wurst, [place], PA-C  cetirizine (ZYRTEC) 10 MG tablet Take 1 tablet (10 mg total) by mouth daily. 10/03/19   Wurst, [place], PA-C  fluticasone (FLONASE) 50 MCG/ACT nasal spray Place 2 sprays into both nostrils daily. 10/03/19   Wurst, [place], PA-C  HYDROcodone-homatropine (HYCODAN) 5-1.5 MG/5ML syrup Take 5 mLs by mouth at bedtime. 10/04/19   Emerson Monte, FNP  Respiratory Therapy Supplies (ONE FLOW SPIROMETER) KIT Use as directed 07/30/19   Wurst, [place], PA-C  metFORMIN (GLUCOPHAGE) 500 MG tablet Take 500  mg by mouth daily.  07/21/19  [provider]    Allergies    Patient has no known allergies.  Review of Systems   Review of Systems  Constitutional: Positive for fatigue. Negative for chills and fever.  HENT: Negative for sore throat and trouble swallowing.   Respiratory: Negative for cough, shortness of breath and wheezing.   Cardiovascular: Negative for chest pain and palpitations.  Gastrointestinal: Positive for abdominal pain and diarrhea. Negative for blood in stool, nausea and vomiting.  Genitourinary: Negative for dysuria and flank pain.  Musculoskeletal: Negative for arthralgias, back pain, myalgias, neck pain and neck  stiffness.  Skin: Negative for rash.  Neurological: Positive for syncope. Negative for dizziness, facial asymmetry, weakness, numbness ("tingling" to both legs) and headaches.  Hematological: Does not bruise/bleed easily.    Physical Exam Updated Vital Signs BP 126/76   Pulse 78   Temp 99 F (37.2 C) (Oral)   Resp 15   Ht '5\' 2"'  (1.575 m)   Wt 86.2 kg   SpO2 90%   BMI 34.75 kg/m   Physical Exam Vitals and nursing note reviewed.  Constitutional:      Appearance: She is not ill-appearing or toxic-appearing.     Comments: Pt appears somnolent  HENT:     Head: Atraumatic.     Right Ear: Tympanic membrane and ear canal normal. No hemotympanum.     Left Ear: Tympanic membrane and ear canal normal. No hemotympanum.     Mouth/Throat:     Mouth: Mucous membranes are moist. Injury present.     Tongue: Tongue does not deviate from midline.     Pharynx: Oropharynx is clear. Uvula midline.     Comments: Abrasion noted to the lateral right tongue.  No edema or active bleeding.   Eyes:     Extraocular Movements: Extraocular movements intact.     Conjunctiva/sclera: Conjunctivae normal.     Pupils: Pupils are equal, round, and reactive to light.     Right eye: Pupil is sluggish.     Left eye: Pupil is sluggish.  Cardiovascular:     Rate and Rhythm: Normal rate and regular rhythm.     Pulses: Normal pulses.  Pulmonary:     Effort: Pulmonary effort is normal.     Breath sounds: Normal breath sounds.  Chest:     Chest wall: No tenderness.  Abdominal:     General: There is no distension.     Palpations: Abdomen is soft.     Tenderness: There is no abdominal tenderness. There is no right CVA tenderness, left CVA tenderness or guarding.  Musculoskeletal:        General: No tenderness. Normal range of motion.     Cervical back: Normal range of motion. No rigidity or tenderness.     Right lower leg: No edema.     Left lower leg: No edema.  Skin:    General: Skin is warm.      Capillary Refill: Capillary refill takes less than 2 seconds.     Findings: No rash.  Neurological:     General: No focal deficit present.     Mental Status: She is alert.     GCS: GCS eye subscore is 4. GCS verbal subscore is 5. GCS motor subscore is 6.     Sensory: Sensation is intact. No sensory deficit.     Motor: Motor function is intact.     Coordination: Coordination is intact.     Comments: CN II-XII  intact.  Speech slow, but clear.  Follows commands appropriately.  nml finger nose testing.  No pronator drift. Grip strength is strong and symmetrical     ED Results / Procedures / Treatments   Labs (all labs ordered are listed, but only abnormal results are displayed) Labs Reviewed  URINALYSIS, ROUTINE W REFLEX MICROSCOPIC - Abnormal; Notable for the following components:      Result Value   Color, Urine STRAW (*)    Specific Gravity, Urine 1.003 (*)    All other components within normal limits  CBC WITH DIFFERENTIAL/PLATELET  COMPREHENSIVE METABOLIC PANEL  MAGNESIUM  PHOSPHORUS  ETHANOL  RAPID URINE DRUG SCREEN, HOSP PERFORMED  LIPASE, BLOOD  CBG MONITORING, ED    EKG EKG Interpretation  Date/Time:  Thursday December 19 2019 12:52:13 EDT Ventricular Rate:  105 PR Interval:    QRS Duration: 110 QT Interval:  339 QTC Calculation: 448 R Axis:   74 Text Interpretation: Sinus tachycardia Minimal ST depression, lateral leads Confirmed by Virgel Manifold [phone]) on 12/19/2019 2:31:20 PM   Radiology CT HEAD WO CONTRAST  Result Date: 12/19/2019 CLINICAL DATA:  Syncopal episode. EXAM: CT HEAD WITHOUT CONTRAST TECHNIQUE: Contiguous axial images were obtained from the base of the skull through the vertex without intravenous contrast. COMPARISON:  None. FINDINGS: Brain: No evidence of acute infarction, hemorrhage, hydrocephalus, extra-axial collection or mass lesion/mass effect. Vascular: No hyperdense vessel or unexpected calcification. Skull: Normal. Negative for fracture or  focal lesion. Sinuses/Orbits: No acute finding. Other: None. IMPRESSION: No acute intracranial pathology. Electronically Signed   By: Virgina Norfolk M.D.   On: 12/19/2019 15:24    Procedures Procedures (including critical care time)  Medications Ordered in ED Medications  sodium chloride 0.9 % bolus 1,000 mL (has no administration in time range)    And  0.9 %  sodium chloride infusion (has no administration in time range)    ED Course  I have reviewed the triage vital signs and the nursing notes.  Pertinent labs & imaging results that were available during my care of the patient were reviewed by me and considered in my medical decision making (see chart for details).    MDM Rules/Calculators/A&P                          Patient here with unwitnessed report of possible seizure.  No history of prior seizures.  On exam, patient does appear postictal.  She was incontinent of urine on arrival and has abrasions to the lateral right tongue.  No focal neuro deficits on exam, labs and CT of the head are unremarkable.  No seizure activity during stay.  she appears to be at her neurological baseline per patient's mother.  [place] neurolog[facility], Dr. Merlene Laughter and discussed findings.  He recommends seizure precautions and he will see patient in office for follow-up.  Patient agrees to plan and appears appropriate for discharge home. Return precautions discussed.   Final Clinical Impression(s) / ED Diagnoses Final diagnoses:  Seizure-like activity [facility])    Rx / Charlo Orders ED Discharge Orders    None       Kem Parkinson, PA-C 12/19/19 1706    Virgel Manifold, MD 12/20/19 [phone]

## 2019-12-19 NOTE — Discharge Instructions (Signed)
As discussed, call Dr. Freddie Apley office tomorrow to arrange a follow-up appointment.  No driving or operating machinery until you are cleared to do so by Dr. Merlene Laughter.  Return to the emergency department if needed or for worsening symptoms.

## 2019-12-19 NOTE — ED Triage Notes (Signed)
Patient found by her mother in bed in postictal state after a possible unwitnessed seizure.  Patient is found to have bit her tongue, patient is alert and tearful in triage.  EMS states mother denies any history of seizures.

## 2019-12-23 ENCOUNTER — Telehealth: Payer: Self-pay | Admitting: Adult Health

## 2019-12-23 NOTE — Telephone Encounter (Signed)
Patient called LVM. Asking for call back.  I am helping phone room with VM.

## 2019-12-27 ENCOUNTER — Ambulatory Visit: Payer: BC Managed Care – PPO | Admitting: Registered Nurse

## 2020-01-02 ENCOUNTER — Telehealth: Payer: Self-pay

## 2020-01-02 ENCOUNTER — Other Ambulatory Visit: Payer: Self-pay

## 2020-01-02 ENCOUNTER — Encounter: Payer: Self-pay | Admitting: Emergency Medicine

## 2020-01-02 ENCOUNTER — Ambulatory Visit: Payer: BC Managed Care – PPO | Admitting: Emergency Medicine

## 2020-01-02 VITALS — BP 144/80 | HR 72 | Temp 96.2°F | Ht 62.0 in | Wt 188.2 lb

## 2020-01-02 DIAGNOSIS — G40909 Epilepsy, unspecified, not intractable, without status epilepticus: Secondary | ICD-10-CM

## 2020-01-02 MED ORDER — CETIRIZINE HCL 10 MG PO TABS: 10.0000 mg | ORAL_TABLET | Freq: Every day | ORAL | 0 refills | Status: DC

## 2020-01-02 MED ORDER — FLUTICASONE PROPIONATE 50 MCG/ACT NA SUSP: 2.0000 | Freq: Every day | NASAL | 0 refills | Status: DC

## 2020-01-02 NOTE — Patient Instructions (Addendum)
   If you have lab work done today you will be contacted with your lab results within the next 2 weeks.  If you have not heard from us then please contact us. The fastest way to get your results is to register for My Chart.   IF you received an x-ray today, you will receive an invoice from [facility]. Please contact [facility] at [phone] with questions or concerns regarding your invoice.   IF you received labwork today, you will receive an invoice from [facility]. Please contact [facility] at [phone] with questions or concerns regarding your invoice.   Our billing staff will not be able to assist you with questions regarding bills from these companies.  You will be contacted with the lab results as soon as they are available. The fastest way to get your results is to activate your My Chart account. Instructions are located on the last page of this paperwork. If you have not heard from us regarding the results in 2 weeks, please contact this office.     Seizure, Adult A seizure is a sudden burst of abnormal electrical activity in the brain. Seizures usually last from 30 seconds to 2 minutes. They can cause many different symptoms. Usually, seizures are not harmful unless they last a long time. What are the causes? Common causes of this condition include:  Fever or infection.  Conditions that affect the brain, such as: ? A brain abnormality that you were born with. ? A brain or head injury. ? Bleeding in the brain. ? A tumor. ? Stroke. ? Brain disorders such as autism or cerebral palsy.  Low blood sugar.  Conditions that are passed from parent to child (are inherited).  Problems with substances, such as: ? Having a reaction to a drug or a medicine. ? Suddenly stopping the use of a substance (withdrawal). In some cases, the cause may not be known. A person who has repeated seizures over time without a clear cause has a condition called epilepsy. What  increases the risk? You are more likely to get this condition if you have:  A family history of epilepsy.  Had a seizure in the past.  A brain disorder.  A history of head injury, lack of oxygen at birth, or strokes. What are the signs or symptoms? There are many types of seizures. The symptoms vary depending on the type of seizure you have. Examples of symptoms during a seizure include:  Shaking (convulsions).  Stiffness in the body.  Passing out (losing consciousness).  Head nodding.  Staring.  Not responding to sound or touch.  Loss of bladder control and bowel control. Some people have symptoms right before and right after a seizure happens. Symptoms before a seizure may include:  Fear.  Worry (anxiety).  Feeling like you may vomit (nauseous).  Feeling like the room is spinning (vertigo).  Feeling like you saw or heard something before (dj vu).  Odd tastes or smells.  Changes in how you see. You may see flashing lights or spots. Symptoms after a seizure happens can include:  Confusion.  Sleepiness.  Headache.  Weakness on one side of the body. How is this treated? Most seizures will stop on their own in under 5 minutes. In these cases, no treatment is needed. Seizures that last longer than 5 minutes will usually need treatment. Treatment can include:  Medicines given through an IV tube.  Avoiding things that are known to cause your seizures. These can include medicines that   you take for another condition.  Medicines to treat epilepsy.  Surgery to stop the seizures. This may be needed if medicines do not help. Follow these instructions at home: Medicines  Take over-the-counter and prescription medicines only as told by your doctor.  Do not eat or drink anything that may keep your medicine from working, such as alcohol. Activity  Do not do any activities that would be dangerous if you had another seizure, like driving or swimming. Wait until  your doctor says it is safe for you to do them.  If you live in the [place] ask your local DMV (department of motor vehicles) when you can drive.  Get plenty of rest. Teaching others Teach friends and family what to do when you have a seizure. They should:  Lay you on the ground.  Protect your head and body.  Loosen any tight clothing around your neck.  Turn you on your side.  Not hold you down.  Not put anything into your mouth.  Know whether or not you need emergency care.  Stay with you until you are better.  General instructions  Contact your doctor each time you have a seizure.  Avoid anything that gives you seizures.  Keep a seizure diary. Write down: ? What you think caused each seizure. ? What you remember about each seizure.  Keep all follow-up visits as told by your doctor. This is important. Contact a doctor if:  You have another seizure.  You have seizures more often.  There is any change in what happens during your seizures.  You keep having seizures with treatment.  You have symptoms of being sick or having an infection. Get help right away if:  You have a seizure that: ? Lasts longer than 5 minutes. ? Is different than seizures you had before. ? Makes it harder to breathe. ? Happens after you hurt your head.  You have any of these symptoms after a seizure: ? Not being able to speak. ? Not being able to use a part of your body. ? Confusion. ? A bad headache.  You have two or more seizures in a row.  You do not wake up right after a seizure.  You get hurt during a seizure. These symptoms may be an emergency. Do not wait to see if the symptoms will go away. Get medical help right away. Call your local emergency services (911 in the [place] Do not drive yourself to the hospital. Summary  Seizures usually last from 30 seconds to 2 minutes. Usually, they are not harmful unless they last a long time.  Do not eat or drink anything that may  keep your medicine from working, such as alcohol.  Teach friends and family what to do when you have a seizure.  Contact your doctor each time you have a seizure. This information is not intended to replace advice given to you by your health care provider. Make sure you discuss any questions you have with your health care provider. Document Revised: 09/14/2018 Document Reviewed: 09/14/2018 Elsevier Patient Education  2020 [employer]  

## 2020-01-02 NOTE — Telephone Encounter (Signed)
FMLA form received at office visit. It was completed at the office visit and then faxed with confirmation. I have called the pt to inform her of this and she will come by and pick up her copy on Monday. A copy has been placed in the scan bit with the confirmation and copy given to CMA for safe keeping.    Faxed to [facility] @ [phone].

## 2020-01-02 NOTE — Progress Notes (Signed)
Jane Heath 59 y.o.   Chief Complaint  Patient presents with  . Seizures    on 12/19/19  . Hospitalization Follow-up    Request FMLA to be completed   . has memory break episodes.    feels off or has outter body exeriance. (this happened several time over the last 1.5 years)  . Allergies    would like flonase and certiline     HISTORY OF PRESENT ILLNESS: This is a 59 y.o. female here for hospital follow-up on 12/19/2019. Summary of ED visit as follows: Patient here with unwitnessed report of possible seizure.  No history of prior seizures.  On exam, patient does appear postictal.  She was incontinent of urine on arrival and has abrasions to the lateral right tongue.  No focal neuro deficits on exam, labs and CT of the head are unremarkable.  No seizure activity during stay.  she appears to be at her neurological baseline per patient's mother.  [place] [facility], Dr. Merlene Laughter and discussed findings.  He recommends seizure precautions and he will see patient in office for follow-up.  Patient agrees to plan and appears appropriate for discharge home. Return precautions discussed.   Final Clinical Impression(s) / ED Diagnoses Final diagnoses:  Seizure-like activity [facility])   Has appointment to see neurologist on Monday, 01/06/2020. Doing well today. Asymptomatic. Has had 2 episodes of possible seizures prior to last this year. Possible petit mal seizures. She describes them as an out of body experience.    HPI   Prior to Admission medications   Medication Sig Start Date End Date Taking? Authorizing Provider  metFORMIN (GLUCOPHAGE) 500 MG tablet Take 500 mg by mouth daily.  07/21/19  [provider]    No Known Allergies  Patient Active Problem List   Diagnosis Date Noted  . Flu-like symptoms 06/06/2017  . Influenza due to identified novel influenza A virus with other respiratory manifestations 06/06/2017  . Laryngitis 06/06/2017    Past Medical History:    Diagnosis Date  . Bronchitis     Past Surgical History:  Procedure Laterality Date  . ABDOMINAL HYSTERECTOMY     1987  . BREAST REDUCTION SURGERY  2004  . tummy tuck  2008    Social History   Socioeconomic History  . Marital status: Single    Spouse name: Not on file  . Number of children: Not on file  . Years of education: Not on file  . Highest education level: Not on file  Occupational History  . Not on file  Tobacco Use  . Smoking status: Never Smoker  . Smokeless tobacco: Never Used  Substance and Sexual Activity  . Alcohol use: Yes    Comment: 2 x week  . Drug use: Never  . Sexual activity: Not on file  Other Topics Concern  . Not on file  Social History Narrative   Lives with mother.  Works for [employer].  Education college 2 yrs.  Divorced.  No children.  Caffeine 12-16 oz daily.   Social Determinants of Health   Financial Resource Strain:   . Difficulty of Paying Living Expenses:   Food Insecurity:   . Worried About Charity fundraiser in the Last Year:   . Arboriculturist in the Last Year:   Transportation Needs:   . Film/video editor (Medical):   Marland Kitchen Lack of Transportation (Non-Medical):   Physical Activity:   . Days of Exercise per Week:   . Minutes of Exercise per  Session:   Stress:   . Feeling of Stress :   Social Connections:   . Frequency of Communication with Friends and Family:   . Frequency of Social Gatherings with Friends and Family:   . Attends Religious Services:   . Active Member of Clubs or Organizations:   . Attends Archivist Meetings:   Marland Kitchen Marital Status:   Intimate Partner Violence:   . Fear of Current or Ex-Partner:   . Emotionally Abused:   Marland Kitchen Physically Abused:   . Sexually Abused:     Family History  Problem Relation Age of Onset  . Hypertension Mother   . Heart disease Mother   . Hypertension Sister   . Diabetes Brother   . Hypertension Brother   . Heart disease Maternal Grandmother   . Alzheimer's  disease Other      Review of Systems  Constitutional: Negative.  Negative for chills and fever.  HENT: Negative.  Negative for congestion, hearing loss and sore throat.   Respiratory: Negative.  Negative for cough and shortness of breath.   Cardiovascular: Negative.  Negative for chest pain and palpitations.  Gastrointestinal: Negative.  Negative for abdominal pain, diarrhea, nausea and vomiting.  Genitourinary: Negative.  Negative for dysuria and hematuria.  Musculoskeletal: Negative.  Negative for myalgias and neck pain.  Skin: Negative.  Negative for rash.  Neurological: Negative for dizziness and headaches.  All other systems reviewed and are negative.  Today's Vitals   01/02/20 1032 01/02/20 1044  BP: (!) 144/90 (!) 144/80  Pulse: 72   Temp: (!) 96.2 F (35.7 C)   TempSrc: Temporal   SpO2: 94%   Weight: 188 lb 3.2 oz (85.4 kg)   Height: 5\' 2"  (1.575 m)    Body mass index is 34.42 kg/m.   Physical Exam Vitals reviewed.  Constitutional:      Appearance: Normal appearance.  HENT:     Head: Normocephalic.  Eyes:     Extraocular Movements: Extraocular movements intact.     Conjunctiva/sclera: Conjunctivae normal.     Pupils: Pupils are equal, round, and reactive to light.  Cardiovascular:     Rate and Rhythm: Normal rate and regular rhythm.     Pulses: Normal pulses.     Heart sounds: Normal heart sounds.  Pulmonary:     Effort: Pulmonary effort is normal.     Breath sounds: Normal breath sounds.  Musculoskeletal:        General: Normal range of motion.     Cervical back: Normal range of motion and neck supple.  Skin:    General: Skin is warm and dry.     Capillary Refill: Capillary refill takes less than 2 seconds.  Neurological:     General: No focal deficit present.     Mental Status: She is alert and oriented to person, place, and time.     Cranial Nerves: No cranial nerve deficit.     Sensory: No sensory deficit.     Motor: No weakness.      Coordination: Coordination normal.     Gait: Gait normal.  Psychiatric:        Mood and Affect: Mood normal.        Behavior: Behavior normal.    A total of 30 minutes was spent with the patient, greater than 50% of which was in counseling/coordination of care regarding seizure disorder, need for additional evaluation and testing, possible medication, review of ER visit notes, review of most recent office visit notes,  review of most recent blood work results, diet and nutrition, prognosis and need for follow-up with neurologist as scheduled next Monday.   ASSESSMENT & PLAN: Jane Heath was seen today for seizures, hospitalization follow-up, has memory break episodes. and allergies.  Diagnoses and all orders for this visit:  Seizure disorder ([place])  Other orders -     cetirizine (ZYRTEC) 10 MG tablet; Take 1 tablet (10 mg total) by mouth daily. -     fluticasone (FLONASE) 50 MCG/ACT nasal spray; Place 2 sprays into both nostrils daily.    Patient Instructions       If you have lab work done today you will be contacted with your lab results within the next 2 weeks.  If you have not heard from [place] then please contact us. The fastest way to get your results is to register for My Chart.   IF you received an x-ray today, you will receive an invoice from [facility]. Please contact [facility] at [phone] with questions or concerns regarding your invoice.   IF you received labwork today, you will receive an invoice from [facility]. Please contact [facility] at [phone] with questions or concerns regarding your invoice.   Our billing staff will not be able to assist you with questions regarding bills from these companies.  You will be contacted with the lab results as soon as they are available. The fastest way to get your results is to activate your My Chart account. Instructions are located on the last page of this paperwork. If you have not heard from [place] regarding the  results in 2 weeks, please contact this office.      Seizure, Adult A seizure is a sudden burst of abnormal electrical activity in the brain. Seizures usually last from 30 seconds to 2 minutes. They can cause many different symptoms. Usually, seizures are not harmful unless they last a long time. What are the causes? Common causes of this condition include:  Fever or infection.  Conditions that affect the brain, such as: ? A brain abnormality that you were born with. ? A brain or head injury. ? Bleeding in the brain. ? A tumor. ? Stroke. ? Brain disorders such as autism or cerebral palsy.  Low blood sugar.  Conditions that are passed from parent to child (are inherited).  Problems with substances, such as: ? Having a reaction to a drug or a medicine. ? Suddenly stopping the use of a substance (withdrawal). In some cases, the cause may not be known. A person who has repeated seizures over time without a clear cause has a condition called epilepsy. What increases the risk? You are more likely to get this condition if you have:  A family history of epilepsy.  Had a seizure in the past.  A brain disorder.  A history of head injury, lack of oxygen at birth, or strokes. What are the signs or symptoms? There are many types of seizures. The symptoms vary depending on the type of seizure you have. Examples of symptoms during a seizure include:  Shaking (convulsions).  Stiffness in the body.  Passing out (losing consciousness).  Head nodding.  Staring.  Not responding to sound or touch.  Loss of bladder control and bowel control. Some people have symptoms right before and right after a seizure happens. Symptoms before a seizure may include:  Fear.  Worry (anxiety).  Feeling like you may vomit (nauseous).  Feeling like the room is spinning (vertigo).  Feeling like you saw or heard  something before (dj vu).  Odd tastes or smells.  Changes in how you see.  You may see flashing lights or spots. Symptoms after a seizure happens can include:  Confusion.  Sleepiness.  Headache.  Weakness on one side of the body. How is this treated? Most seizures will stop on their own in under 5 minutes. In these cases, no treatment is needed. Seizures that last longer than 5 minutes will usually need treatment. Treatment can include:  Medicines given through an IV tube.  Avoiding things that are known to cause your seizures. These can include medicines that you take for another condition.  Medicines to treat epilepsy.  Surgery to stop the seizures. This may be needed if medicines do not help. Follow these instructions at home: Medicines  Take over-the-counter and prescription medicines only as told by your doctor.  Do not eat or drink anything that may keep your medicine from working, such as alcohol. Activity  Do not do any activities that would be dangerous if you had another seizure, like driving or swimming. Wait until your doctor says it is safe for you to do them.  If you live in the [place] ask your local DMV (department of motor vehicles) when you can drive.  Get plenty of rest. Teaching others Teach friends and family what to do when you have a seizure. They should:  Lay you on the ground.  Protect your head and body.  Loosen any tight clothing around your neck.  Turn you on your side.  Not hold you down.  Not put anything into your mouth.  Know whether or not you need emergency care.  Stay with you until you are better.  General instructions  Contact your doctor each time you have a seizure.  Avoid anything that gives you seizures.  Keep a seizure diary. Write down: ? What you think caused each seizure. ? What you remember about each seizure.  Keep all follow-up visits as told by your doctor. This is important. Contact a doctor if:  You have another seizure.  You have seizures more often.  There is any change  in what happens during your seizures.  You keep having seizures with treatment.  You have symptoms of being sick or having an infection. Get help right away if:  You have a seizure that: ? Lasts longer than 5 minutes. ? Is different than seizures you had before. ? Makes it harder to breathe. ? Happens after you hurt your head.  You have any of these symptoms after a seizure: ? Not being able to speak. ? Not being able to use a part of your body. ? Confusion. ? A bad headache.  You have two or more seizures in a row.  You do not wake up right after a seizure.  You get hurt during a seizure. These symptoms may be an emergency. Do not wait to see if the symptoms will go away. Get medical help right away. Call your local emergency services (911 in the [place] Do not drive yourself to the hospital. Summary  Seizures usually last from 30 seconds to 2 minutes. Usually, they are not harmful unless they last a long time.  Do not eat or drink anything that may keep your medicine from working, such as alcohol.  Teach friends and family what to do when you have a seizure.  Contact your doctor each time you have a seizure. This information is not intended to replace advice given to you by your  health care provider. Make sure you discuss any questions you have with your health care provider. Document Revised: 09/14/2018 Document Reviewed: 09/14/2018 Elsevier Patient Education  2020 Elsevier Inc.     [name], MD [facility]

## 2020-01-06 ENCOUNTER — Other Ambulatory Visit: Payer: Self-pay

## 2020-01-06 ENCOUNTER — Ambulatory Visit: Payer: BC Managed Care – PPO | Admitting: Diagnostic Neuroimaging

## 2020-01-06 ENCOUNTER — Encounter: Payer: Self-pay | Admitting: Diagnostic Neuroimaging

## 2020-01-06 VITALS — BP 157/93 | HR 72 | Ht 63.0 in | Wt 187.4 lb

## 2020-01-06 DIAGNOSIS — G4733 Obstructive sleep apnea (adult) (pediatric): Secondary | ICD-10-CM

## 2020-01-06 DIAGNOSIS — R569 Unspecified convulsions: Secondary | ICD-10-CM | POA: Diagnosis not present

## 2020-01-06 MED ORDER — LAMOTRIGINE 25 MG PO TABS: ORAL_TABLET | ORAL | 3 refills | Status: DC
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
## 2020-01-06 NOTE — Patient Instructions (Signed)
SEIZURE DISORDER - check MRI brain, EEG  - start lamotrigine (can also help with mood)  - According to [place] law, you can not drive unless you are seizure / syncope free for at least 6 months and under physician's care.   - Please maintain precautions. Do not participate in activities where a loss of awareness could harm you or someone else. No swimming alone, no tub bathing, no hot tubs, no driving, no operating motorized vehicles (cars, ATVs, motocycles, etc), lawnmowers, power tools or firearms. No standing at heights, such as rooftops, ladders or stairs. Avoid hot objects such as stoves, heaters, open fires. Wear a helmet when riding a bicycle, scooter, skateboard, etc. and avoid areas of traffic. Set your water heater to 120 degrees or less.   OSA (h/o OSA in 2010; not on treatment) - refer to sleep studies

## 2020-01-07 ENCOUNTER — Telehealth: Payer: Self-pay | Admitting: Emergency Medicine

## 2020-01-07 ENCOUNTER — Telehealth: Payer: Self-pay | Admitting: *Deleted

## 2020-01-07 NOTE — Telephone Encounter (Signed)
Received [employer] [facility] Attending Physician Statement and [place] [employer] FMLA forms from patient.

## 2020-01-07 NOTE — Telephone Encounter (Signed)
Patient called to let her pcp know of the referral appointment for neurology and to see about the return date if that will be the same or if it needed to be changed based on the neurology appointment   Please advise

## 2020-01-08 NOTE — Telephone Encounter (Addendum)
[employer] STD and [place] de[employer] FMLA placed on MD's desk for review, completion, signatures.

## 2020-01-08 NOTE — Telephone Encounter (Signed)
08/18/2020 scheduled appointment totally unacceptable.  She needs to be seen by a neurologist in the next 1 to 2 months at the most.  This referral needs to be resent and rescheduled. Thanks.

## 2020-01-08 NOTE — Telephone Encounter (Signed)
Pt has a neuro appt 08/18/2020. She would like to know if her appt with you will be the same or does it need to be changed based on when she is scheduled to see neuro.  Please Advise.

## 2020-01-08 NOTE — Telephone Encounter (Signed)
I have sent her a message thru her MyChart for her to schedule a F/U appt if she has not done so since her last visit with neuro on 01/06/2020.  Thank you,  Leamon Arnt

## 2020-01-09 ENCOUNTER — Telehealth: Payer: Self-pay | Admitting: *Deleted

## 2020-01-09 ENCOUNTER — Telehealth: Payer: Self-pay | Admitting: Diagnostic Neuroimaging

## 2020-01-09 NOTE — Telephone Encounter (Signed)
Pt FMLA form @ the front desk for p/u

## 2020-01-09 NOTE — Telephone Encounter (Signed)
Noted,  Thank you!

## 2020-01-09 NOTE — Telephone Encounter (Signed)
[name] called and stated we don't need to do anything but fax order [phone]  [name] [phone] opt 2 and then opt 3  Order has been faxed

## 2020-01-09 NOTE — Telephone Encounter (Signed)
Patient called wants to have her MRI at [facility]  please fax order to [phone]  [facility] . Patient states her insurance called her about where she wanted to go for her MRI and they told her it did not need authorization and let her choice the location where she wanted to and provided her a reference number.  # [id number] .I asked patient did she No what her benefits were she stated she didn't . I asked her did she no what she had to pay or if she had to pay anything . I relayed to patient I haven't hurd of this but I would send her message to MRI coordinator she would no more.

## 2020-01-14 MED ORDER — ALPRAZOLAM 0.5 MG PO TABS: ORAL_TABLET | ORAL | 0 refills | Status: DC

## 2020-01-14 NOTE — Telephone Encounter (Signed)
scheduled for 01/22/20 at [place]

## 2020-01-14 NOTE — Telephone Encounter (Signed)
Patient informed me she is claustrophobic and would need something to help her.

## 2020-01-14 NOTE — Telephone Encounter (Signed)
Meds ordered this encounter  Medications  . ALPRAZolam (XANAX) 0.5 MG tablet    Sig: for sedation before MRI scan; take 1 tab 1 hour before scan; may repeat 1 tab 15 min before scan    Dispense:  3 tablet    Refill:  0    Penni Bombard, MD 07/12/3933, 9:40 PM Certified in Neurology, Neurophysiology and Neuroimaging  [facility] [address] [place], [place] [address] [phone]

## 2020-01-14 NOTE — Addendum Note (Signed)
Addended by: Andrey Spearman R on: 01/14/2020 05:02 PM   Modules accepted: Orders

## 2020-01-15 ENCOUNTER — Encounter: Payer: Self-pay | Admitting: *Deleted

## 2020-01-15 NOTE — Telephone Encounter (Signed)
Sent my chart: prescription ready at front desk for pick up.

## 2020-01-17 ENCOUNTER — Telehealth: Payer: Self-pay

## 2020-01-17 NOTE — Telephone Encounter (Signed)
Copied from [place] [id number]. Topic: General - Other >> Jan 02, 2020 11:50 AM Leward Quan A wrote: Reason for CRM: Patient called to inform Dr Mitchel Honour not to fill out the [facility] paperwork that was left at the office until after she sees the Neurologist. Patient states that she will call after her visit to inform Dr Mitchel Honour when its ok to complete the forms. Any questions please call Ph# [phone]

## 2020-01-22 DIAGNOSIS — R569 Unspecified convulsions: Secondary | ICD-10-CM | POA: Diagnosis not present

## 2020-01-22 DIAGNOSIS — R413 Other amnesia: Secondary | ICD-10-CM | POA: Diagnosis not present

## 2020-01-23 ENCOUNTER — Telehealth: Payer: Self-pay | Admitting: *Deleted

## 2020-01-23 ENCOUNTER — Encounter: Payer: Self-pay | Admitting: Emergency Medicine

## 2020-01-23 NOTE — Telephone Encounter (Signed)
Received MRI brain report from [facility]. Placed on MD's desk for review.

## 2020-01-23 NOTE — Telephone Encounter (Signed)
Called patient , LVM informing her Dr Leta Baptist reviewed MRI head report for [facility]. It looks fine. Left # for questions.

## 2020-01-27 ENCOUNTER — Encounter: Payer: Self-pay | Admitting: Emergency Medicine

## 2020-01-27 ENCOUNTER — Other Ambulatory Visit: Payer: Self-pay

## 2020-01-27 ENCOUNTER — Ambulatory Visit (INDEPENDENT_AMBULATORY_CARE_PROVIDER_SITE_OTHER): Payer: BC Managed Care – PPO | Admitting: Emergency Medicine

## 2020-01-27 VITALS — BP 132/86 | HR 79 | Temp 97.4°F | Ht 63.0 in | Wt 188.0 lb

## 2020-01-27 DIAGNOSIS — Z6833 Body mass index (BMI) 33.0-33.9, adult: Secondary | ICD-10-CM

## 2020-01-27 DIAGNOSIS — G40909 Epilepsy, unspecified, not intractable, without status epilepticus: Secondary | ICD-10-CM | POA: Diagnosis not present

## 2020-01-27 DIAGNOSIS — R7303 Prediabetes: Secondary | ICD-10-CM

## 2020-01-27 NOTE — Progress Notes (Signed)
Jane Heath 59 y.o.   Chief Complaint  Patient presents with  . balance issues    notices dropping items, seizure last week   . discuss going back to work / [employer] extension    EEG and sleep study appt set up , 2nd week lamictal    HISTORY OF PRESENT ILLNESS: This is a 59 y.o. female here for follow-up of visit on 01/02/2020 with suspected seizure disorder. Saw neurologist the next week and had brain MRI done.  Was started on Lamictal.  Presently taking 50 mg twice a day. Schedule for EEG next week and sleep consultation tomorrow. Brain MRI report reviewed with patient.  Unremarkable. Still having some issues with balance. Also needs FMLA extension.  Had been working third shift but advised to seek change of shift.  HPI   Prior to Admission medications   Medication Sig Start Date End Date Taking? Authorizing Provider  lamoTRIgine (LAMICTAL) 25 MG tablet Take 25mg  daily for 2 weeks; then take 25mg  BID for 2 weeks; then take 50mg  BID for 2 weeks; then take 75mg  BID for 2 weeks; then 100mg  BID 01/06/20  Yes Penumalli, Earlean Polka, MD  ALPRAZolam (XANAX) 0.5 MG tablet for sedation before MRI scan; take 1 tab 1 hour before scan; may repeat 1 tab 15 min before scan Patient not taking: Reported on 01/27/2020 01/14/20   Penumalli, Earlean Polka, MD  cetirizine (ZYRTEC) 10 MG tablet Take 1 tablet (10 mg total) by mouth daily. Patient not taking: Reported on 01/27/2020 01/02/20   Horald Pollen, MD  fluticasone [facility]) 50 MCG/ACT nasal spray Place 2 sprays into both nostrils daily. Patient not taking: Reported on 01/27/2020 01/02/20   Horald Pollen, MD  metFORMIN (GLUCOPHAGE) 500 MG tablet Take 500 mg by mouth daily.  07/21/19  [provider]    No Known Allergies  Patient Active Problem List   Diagnosis Date Noted  . Seizure disorder ([place]) 01/02/2020  . Flu-like symptoms 06/06/2017  . Influenza due to identified novel influenza A virus with other respiratory manifestations  06/06/2017  . Laryngitis 06/06/2017    Past Medical History:  Diagnosis Date  . Bronchitis   . COVID-19 virus infection 07/2019  . Seizure ([place]) 06/2019   most recent 12/19/19    Past Surgical History:  Procedure Laterality Date  . ABDOMINAL HYSTERECTOMY     1987  . BREAST REDUCTION SURGERY  2004  . tummy tuck  2008    Social History   Socioeconomic History  . Marital status: Single    Spouse name: Not on file  . Number of children: 2  . Years of education: Not on file  . Highest education level: Not on file  Occupational History    Comment: third shift  Tobacco Use  . Smoking status: Never Smoker  . Smokeless tobacco: Never Used  Substance and Sexual Activity  . Alcohol use: Yes    Comment: 2 x week  . Drug use: Never  . Sexual activity: Not on file  Other Topics Concern  . Not on file  Social History Narrative   Lives with mother.  Works for [employer].  Education college 2 yrs.  Divorced.  No children.  Caffeine 12-16 oz daily.   Social Determinants of Health   Financial Resource Strain:   . Difficulty of Paying Living Expenses:   Food Insecurity:   . Worried About Charity fundraiser in the Last Year:   . [place] in the Last  Year:   Transportation Needs:   . Film/video editor (Medical):   Marland Kitchen Lack of Transportation (Non-Medical):   Physical Activity:   . Days of Exercise per Week:   . Minutes of Exercise per Session:   Stress:   . Feeling of Stress :   Social Connections:   . Frequency of Communication with Friends and Family:   . Frequency of Social Gatherings with Friends and Family:   . Attends Religious Services:   . Active Member of Clubs or Organizations:   . Attends Archivist Meetings:   Marland Kitchen Marital Status:   Intimate Partner Violence:   . Fear of Current or Ex-Partner:   . Emotionally Abused:   Marland Kitchen Physically Abused:   . Sexually Abused:     Family History  Problem Relation Age of Onset  . Hypertension Mother   .  Heart disease Mother   . Hypertension Sister   . Diabetes Brother   . Hypertension Brother   . Heart disease Maternal Grandmother   . Alzheimer's disease Other      Review of Systems  Constitutional: Negative.  Negative for chills and fever.  HENT: Negative.  Negative for congestion and sore throat.   Respiratory: Negative.  Negative for cough and shortness of breath.   Cardiovascular: Negative.  Negative for chest pain and palpitations.  Gastrointestinal: Negative.  Negative for abdominal pain, diarrhea, nausea and vomiting.  Genitourinary: Negative.  Negative for dysuria and hematuria.  Musculoskeletal: Negative.  Negative for myalgias and neck pain.  Skin: Negative.  Negative for rash.  Neurological: Positive for seizures. Negative for dizziness, sensory change, focal weakness and headaches.  All other systems reviewed and are negative.  Today's Vitals   01/27/20 0923  BP: 132/86  Pulse: 79  Temp: (!) 97.4 F (36.3 C)  SpO2: 96%  Weight: 188 lb (85.3 kg)  Height: 5\' 3"  (1.6 m)   Body mass index is 33.3 kg/m.   Physical Exam Vitals reviewed.  Constitutional:      Appearance: Normal appearance.  HENT:     Head: Normocephalic.  Eyes:     Extraocular Movements: Extraocular movements intact.     Pupils: Pupils are equal, round, and reactive to light.  Cardiovascular:     Rate and Rhythm: Normal rate and regular rhythm.     Heart sounds: Normal heart sounds.  Pulmonary:     Effort: Pulmonary effort is normal.     Breath sounds: Normal breath sounds.  Musculoskeletal:        General: Normal range of motion.     Cervical back: Normal range of motion.  Skin:    General: Skin is warm and dry.  Neurological:     General: No focal deficit present.     Mental Status: She is alert and oriented to person, place, and time.  Psychiatric:        Mood and Affect: Mood normal.        Behavior: Behavior normal.     A total of 30 minutes was spent with the patient, greater  than 50% of which was in counseling/coordination of care regarding diagnosis of seizure, review of all medications, review of recent brain MRI report, review of most recent office visit notes, review of neurologist's office visit note, review of most recent blood work results, prognosis and need for follow-up after EEG and sleep studies are conducted..   ASSESSMENT & PLAN: Cathy was seen today for balance issues and discuss going back to work /  fmla extension.  Diagnoses and all orders for this visit:  Seizure disorder ([place])  Prediabetes  Body mass index (BMI) of 33.0-33.9 in adult    Patient Instructions       If you have lab work done today you will be contacted with your lab results within the next 2 weeks.  If you have not heard from [place] then please contact us. The fastest way to get your results is to register for My Chart.   IF you received an x-ray today, you will receive an invoice from [facility]. Please contact [facility] at [phone] with questions or concerns regarding your invoice.   IF you received labwork today, you will receive an invoice from [facility]. Please contact [facility] at [phone] with questions or concerns regarding your invoice.   Our billing staff will not be able to assist you with questions regarding bills from these companies.  You will be contacted with the lab results as soon as they are available. The fastest way to get your results is to activate your My Chart account. Instructions are located on the last page of this paperwork. If you have not heard from [place] regarding the results in 2 weeks, please contact this office.      Seizure, Adult A seizure is a sudden burst of abnormal electrical activity in the brain. Seizures usually last from 30 seconds to 2 minutes. They can cause many different symptoms. Usually, seizures are not harmful unless they last a long time. What are the causes? Common causes of this condition  include:  Fever or infection.  Conditions that affect the brain, such as: ? A brain abnormality that you were born with. ? A brain or head injury. ? Bleeding in the brain. ? A tumor. ? Stroke. ? Brain disorders such as autism or cerebral palsy.  Low blood sugar.  Conditions that are passed from parent to child (are inherited).  Problems with substances, such as: ? Having a reaction to a drug or a medicine. ? Suddenly stopping the use of a substance (withdrawal). In some cases, the cause may not be known. A person who has repeated seizures over time without a clear cause has a condition called epilepsy. What increases the risk? You are more likely to get this condition if you have:  A family history of epilepsy.  Had a seizure in the past.  A brain disorder.  A history of head injury, lack of oxygen at birth, or strokes. What are the signs or symptoms? There are many types of seizures. The symptoms vary depending on the type of seizure you have. Examples of symptoms during a seizure include:  Shaking (convulsions).  Stiffness in the body.  Passing out (losing consciousness).  Head nodding.  Staring.  Not responding to sound or touch.  Loss of bladder control and bowel control. Some people have symptoms right before and right after a seizure happens. Symptoms before a seizure may include:  Fear.  Worry (anxiety).  Feeling like you may vomit (nauseous).  Feeling like the room is spinning (vertigo).  Feeling like you saw or heard something before (dj vu).  Odd tastes or smells.  Changes in how you see. You may see flashing lights or spots. Symptoms after a seizure happens can include:  Confusion.  Sleepiness.  Headache.  Weakness on one side of the body. How is this treated? Most seizures will stop on their own in under 5 minutes. In these cases, no treatment is needed. Seizures that  last longer than 5 minutes will usually need treatment.  Treatment can include:  Medicines given through an IV tube.  Avoiding things that are known to cause your seizures. These can include medicines that you take for another condition.  Medicines to treat epilepsy.  Surgery to stop the seizures. This may be needed if medicines do not help. Follow these instructions at home: Medicines  Take over-the-counter and prescription medicines only as told by your doctor.  Do not eat or drink anything that may keep your medicine from working, such as alcohol. Activity  Do not do any activities that would be dangerous if you had another seizure, like driving or swimming. Wait until your doctor says it is safe for you to do them.  If you live in the [place] ask your local DMV (department of motor vehicles) when you can drive.  Get plenty of rest. Teaching others Teach friends and family what to do when you have a seizure. They should:  Lay you on the ground.  Protect your head and body.  Loosen any tight clothing around your neck.  Turn you on your side.  Not hold you down.  Not put anything into your mouth.  Know whether or not you need emergency care.  Stay with you until you are better.  General instructions  Contact your doctor each time you have a seizure.  Avoid anything that gives you seizures.  Keep a seizure diary. Write down: ? What you think caused each seizure. ? What you remember about each seizure.  Keep all follow-up visits as told by your doctor. This is important. Contact a doctor if:  You have another seizure.  You have seizures more often.  There is any change in what happens during your seizures.  You keep having seizures with treatment.  You have symptoms of being sick or having an infection. Get help right away if:  You have a seizure that: ? Lasts longer than 5 minutes. ? Is different than seizures you had before. ? Makes it harder to breathe. ? Happens after you hurt your head.  You have any  of these symptoms after a seizure: ? Not being able to speak. ? Not being able to use a part of your body. ? Confusion. ? A bad headache.  You have two or more seizures in a row.  You do not wake up right after a seizure.  You get hurt during a seizure. These symptoms may be an emergency. Do not wait to see if the symptoms will go away. Get medical help right away. Call your local emergency services (911 in the [place] Do not drive yourself to the hospital. Summary  Seizures usually last from 30 seconds to 2 minutes. Usually, they are not harmful unless they last a long time.  Do not eat or drink anything that may keep your medicine from working, such as alcohol.  Teach friends and family what to do when you have a seizure.  Contact your doctor each time you have a seizure. This information is not intended to replace advice given to you by your health care provider. Make sure you discuss any questions you have with your health care provider. Document Revised: 09/14/2018 Document Reviewed: 09/14/2018 Elsevier Patient Education  2020 Elsevier Inc.      [name], MD [facility]

## 2020-01-27 NOTE — Patient Instructions (Addendum)
   If you have lab work done today you will be contacted with your lab results within the next 2 weeks.  If you have not heard from us then please contact us. The fastest way to get your results is to register for My Chart.   IF you received an x-ray today, you will receive an invoice from [facility]. Please contact [facility] at [phone] with questions or concerns regarding your invoice.   IF you received labwork today, you will receive an invoice from [facility]. Please contact [facility] at [phone] with questions or concerns regarding your invoice.   Our billing staff will not be able to assist you with questions regarding bills from these companies.  You will be contacted with the lab results as soon as they are available. The fastest way to get your results is to activate your My Chart account. Instructions are located on the last page of this paperwork. If you have not heard from us regarding the results in 2 weeks, please contact this office.     Seizure, Adult A seizure is a sudden burst of abnormal electrical activity in the brain. Seizures usually last from 30 seconds to 2 minutes. They can cause many different symptoms. Usually, seizures are not harmful unless they last a long time. What are the causes? Common causes of this condition include:  Fever or infection.  Conditions that affect the brain, such as: ? A brain abnormality that you were born with. ? A brain or head injury. ? Bleeding in the brain. ? A tumor. ? Stroke. ? Brain disorders such as autism or cerebral palsy.  Low blood sugar.  Conditions that are passed from parent to child (are inherited).  Problems with substances, such as: ? Having a reaction to a drug or a medicine. ? Suddenly stopping the use of a substance (withdrawal). In some cases, the cause may not be known. A person who has repeated seizures over time without a clear cause has a condition called epilepsy. What  increases the risk? You are more likely to get this condition if you have:  A family history of epilepsy.  Had a seizure in the past.  A brain disorder.  A history of head injury, lack of oxygen at birth, or strokes. What are the signs or symptoms? There are many types of seizures. The symptoms vary depending on the type of seizure you have. Examples of symptoms during a seizure include:  Shaking (convulsions).  Stiffness in the body.  Passing out (losing consciousness).  Head nodding.  Staring.  Not responding to sound or touch.  Loss of bladder control and bowel control. Some people have symptoms right before and right after a seizure happens. Symptoms before a seizure may include:  Fear.  Worry (anxiety).  Feeling like you may vomit (nauseous).  Feeling like the room is spinning (vertigo).  Feeling like you saw or heard something before (dj vu).  Odd tastes or smells.  Changes in how you see. You may see flashing lights or spots. Symptoms after a seizure happens can include:  Confusion.  Sleepiness.  Headache.  Weakness on one side of the body. How is this treated? Most seizures will stop on their own in under 5 minutes. In these cases, no treatment is needed. Seizures that last longer than 5 minutes will usually need treatment. Treatment can include:  Medicines given through an IV tube.  Avoiding things that are known to cause your seizures. These can include medicines that   you take for another condition.  Medicines to treat epilepsy.  Surgery to stop the seizures. This may be needed if medicines do not help. Follow these instructions at home: Medicines  Take over-the-counter and prescription medicines only as told by your doctor.  Do not eat or drink anything that may keep your medicine from working, such as alcohol. Activity  Do not do any activities that would be dangerous if you had another seizure, like driving or swimming. Wait until  your doctor says it is safe for you to do them.  If you live in the [place] ask your local DMV (department of motor vehicles) when you can drive.  Get plenty of rest. Teaching others Teach friends and family what to do when you have a seizure. They should:  Lay you on the ground.  Protect your head and body.  Loosen any tight clothing around your neck.  Turn you on your side.  Not hold you down.  Not put anything into your mouth.  Know whether or not you need emergency care.  Stay with you until you are better.  General instructions  Contact your doctor each time you have a seizure.  Avoid anything that gives you seizures.  Keep a seizure diary. Write down: ? What you think caused each seizure. ? What you remember about each seizure.  Keep all follow-up visits as told by your doctor. This is important. Contact a doctor if:  You have another seizure.  You have seizures more often.  There is any change in what happens during your seizures.  You keep having seizures with treatment.  You have symptoms of being sick or having an infection. Get help right away if:  You have a seizure that: ? Lasts longer than 5 minutes. ? Is different than seizures you had before. ? Makes it harder to breathe. ? Happens after you hurt your head.  You have any of these symptoms after a seizure: ? Not being able to speak. ? Not being able to use a part of your body. ? Confusion. ? A bad headache.  You have two or more seizures in a row.  You do not wake up right after a seizure.  You get hurt during a seizure. These symptoms may be an emergency. Do not wait to see if the symptoms will go away. Get medical help right away. Call your local emergency services (911 in the [place] Do not drive yourself to the hospital. Summary  Seizures usually last from 30 seconds to 2 minutes. Usually, they are not harmful unless they last a long time.  Do not eat or drink anything that may  keep your medicine from working, such as alcohol.  Teach friends and family what to do when you have a seizure.  Contact your doctor each time you have a seizure. This information is not intended to replace advice given to you by your health care provider. Make sure you discuss any questions you have with your health care provider. Document Revised: 09/14/2018 Document Reviewed: 09/14/2018 Elsevier Patient Education  2020 Elsevier Inc.  

## 2020-01-28 ENCOUNTER — Ambulatory Visit: Payer: BC Managed Care – PPO | Admitting: Neurology

## 2020-01-28 ENCOUNTER — Encounter: Payer: Self-pay | Admitting: Neurology

## 2020-01-28 VITALS — BP 142/84 | HR 71 | Ht 63.0 in | Wt 191.0 lb

## 2020-01-28 DIAGNOSIS — R519 Headache, unspecified: Secondary | ICD-10-CM

## 2020-01-28 DIAGNOSIS — R351 Nocturia: Secondary | ICD-10-CM

## 2020-01-28 DIAGNOSIS — G4726 Circadian rhythm sleep disorder, shift work type: Secondary | ICD-10-CM | POA: Diagnosis not present

## 2020-01-28 DIAGNOSIS — G4733 Obstructive sleep apnea (adult) (pediatric): Secondary | ICD-10-CM

## 2020-01-28 DIAGNOSIS — G4719 Other hypersomnia: Secondary | ICD-10-CM

## 2020-01-28 DIAGNOSIS — G40909 Epilepsy, unspecified, not intractable, without status epilepticus: Secondary | ICD-10-CM

## 2020-01-28 DIAGNOSIS — E669 Obesity, unspecified: Secondary | ICD-10-CM

## 2020-01-28 NOTE — Progress Notes (Signed)
Subjective:    Patient ID: Jane Heath is a 59 y.o. female.  HPI     Star Age, MD, PhD [facility] [address]O. [place], [place] [address]  Dear [name],   I saw your patient, Jane Heath, upon your kind request, in my sleep clinic today for initial consultation of her sleep disorder, in particular, re-evaluation of her diagnosis of OSA. The patient is unaccompanied today. As you know, [name] is a 59 year old right-handed woman with an underlying medical history of seizure disorder and obesity, who was previously diagnosed with obstructive sleep apnea and placed on positive airway pressure treatment.  She is no longer on a CPAP machine, she could not tolerate treatment at the time.  She reports testing was about 10+ years ago and prior sleep study results are not available for my review today.  I reviewed the office note from 01/06/2020.  Her Epworth sleepiness score is 13 out of 24, fatigue severity score is 46 out of 63.  She is currently in the process of titrating on Lamictal.  She is currently on leave, has been working night shift for the past 1+ year and reports that it was difficult to adjust to third shift.  She works in Sales executive.  She has an 8-hour work schedule currently, prior to that, for about 10 months she had a 12-hour shift.  She is a restless sleeper.  She reported that CPAP was difficult to tolerate because of her constant shifting and changing positions.  She has had occasional morning headaches and has nocturia about once per average night, on the weekend and when she is off work she likes to sleep at night and tends to sleep better at night.  She has no family history of sleep apnea.  She had a tonsillectomy as a child.  She lives with her mother who is 54 years old.  Patient is a non-smoker and drinks alcohol occasionally in the form of wine, caffeine 1 or 2/day, mostly 1 currently, to when she is working.  They  have a dog in the household, she does have a TV in the bedroom and it tends to stay on or she tries to turn it off before falling asleep.  Bedtime currently is at night but when she works she goes to bed around 10 AM and tends to sleep till 2 or 3 PM.  Work schedule is typically 10:45 PM to 7:15 AM.  She would be willing to get retested for sleep apnea and consider CPAP or AutoPap therapy.  Her weight has increased in the past 2+ years, she moved from [place] to [place] about 2-1/2 years ago.  Her Past Medical History Is Significant For: Past Medical History:  Diagnosis Date  . Bronchitis   . COVID-19 virus infection 07/2019  . Seizure ([place]) 06/2019   most recent 12/19/19    Her Past Surgical History Is Significant For: Past Surgical History:  Procedure Laterality Date  . ABDOMINAL HYSTERECTOMY     1987  . BREAST REDUCTION SURGERY  2004  . tummy tuck  2008    Her Family History Is Significant For: Family History  Problem Relation Age of Onset  . Hypertension Mother   . Heart disease Mother   . Hypertension Sister   . Diabetes Brother   . Hypertension Brother   . Heart disease Maternal Grandmother   . Alzheimer's disease Other     Her Social History Is Significant For:  Social History   Socioeconomic History  . Marital status: Single    Spouse name: Not on file  . Number of children: 2  . Years of education: Not on file  . Highest education level: Not on file  Occupational History    Comment: third shift  Tobacco Use  . Smoking status: Never Smoker  . Smokeless tobacco: Never Used  Substance and Sexual Activity  . Alcohol use: Yes    Comment: 2 x week  . Drug use: Never  . Sexual activity: Not on file  Other Topics Concern  . Not on file  Social History Narrative   Lives with mother.  Works for [employer].  Education college 2 yrs.  Divorced.  No children.  Caffeine 12-16 oz daily.   Social Determinants of Health   Financial Resource Strain:   . Difficulty  of Paying Living Expenses:   Food Insecurity:   . Worried About Charity fundraiser in the Last Year:   . Arboriculturist in the Last Year:   Transportation Needs:   . Film/video editor (Medical):   Marland Kitchen Lack of Transportation (Non-Medical):   Physical Activity:   . Days of Exercise per Week:   . Minutes of Exercise per Session:   Stress:   . Feeling of Stress :   Social Connections:   . Frequency of Communication with Friends and Family:   . Frequency of Social Gatherings with Friends and Family:   . Attends Religious Services:   . Active Member of Clubs or Organizations:   . Attends Archivist Meetings:   Marland Kitchen Marital Status:     Her Allergies Are:  No Known Allergies:   Her Current Medications Are:  Outpatient Encounter Medications as of 01/28/2020  Medication Sig  . lamoTRIgine (LAMICTAL) 25 MG tablet Take 25mg  daily for 2 weeks; then take 25mg  BID for 2 weeks; then take 50mg  BID for 2 weeks; then take 75mg  BID for 2 weeks; then 100mg  BID  . ALPRAZolam (XANAX) 0.5 MG tablet for sedation before MRI scan; take 1 tab 1 hour before scan; may repeat 1 tab 15 min before scan (Patient not taking: Reported on 01/27/2020)  . cetirizine (ZYRTEC) 10 MG tablet Take 1 tablet (10 mg total) by mouth daily. (Patient not taking: Reported on 01/27/2020)  . fluticasone (FLONASE) 50 MCG/ACT nasal spray Place 2 sprays into both nostrils daily. (Patient not taking: Reported on 01/27/2020)  . [DISCONTINUED] metFORMIN (GLUCOPHAGE) 500 MG tablet Take 500 mg by mouth daily.   No facility-administered encounter medications on file as of 01/28/2020.  :  Review of Systems:  Out of a complete 14 point review of systems, all are reviewed and negative with the exception of these symptoms as listed below: Review of Systems  Neurological:       Here for sleep consult. Prior sleep study 10+ years ago. Pt reports at that time cpap was recommended but she could not tolerate and discontinued.   Epworth  Sleepiness Scale 0= would never doze 1= slight chance of dozing 2= moderate chance of dozing 3= high chance of dozing  Sitting and reading:3 Watching TV:2 Sitting inactive in a public place (ex. Theater or meeting):2 As a passenger in a car for an hour without a break:1 Lying down to rest in the afternoon:1 Sitting and talking to someone:1 Sitting quietly after lunch (no alcohol):1 In a car, while stopped in traffic:2 Total:13     Objective:  Neurological Exam  Physical  Exam Physical Examination:   Vitals:   01/28/20 0747  BP: (!) 142/84  Pulse: 71  SpO2: 98%    General Examination: The patient is a very pleasant 59 y.o. female in no acute distress. She appears well-developed and well-nourished and well groomed.   HEENT: Normocephalic, atraumatic, pupils are equal, round and reactive to light, extraocular tracking is good without limitation to gaze excursion or nystagmus noted. Hearing is grossly intact. Face is symmetric with normal facial animation. Speech is clear with no dysarthria noted. There is no hypophonia. There is no lip, neck/head, jaw or voice tremor. Neck is supple with full range of passive and active motion. There are no carotid bruits on auscultation. Oropharynx exam reveals: mild mouth dryness, good dental hygiene and moderate airway crowding, due to smaller airway entry and wider uvula, redundant soft palate.  Mallampati class III, tonsils are absent, tongue protrudes centrally in palate elevates symmetrically, neck circumference of 15-1/8 inches.  She has a minimal overbite.  Chest: Clear to auscultation without wheezing, rhonchi or crackles noted.  Heart: S1+S2+0, regular and normal without murmurs, rubs or gallops noted.   Abdomen: Soft, non-tender and non-distended with normal bowel sounds appreciated on auscultation.  Extremities: There is no pitting edema in the distal lower extremities bilaterally.   Skin: Warm and dry without trophic changes  noted.   Musculoskeletal: exam reveals no obvious joint deformities, tenderness or joint swelling or erythema.  Nonpitting puffiness lateral ankle bilaterally.  Neurologically:  Mental status: The patient is awake, alert and oriented in all 4 spheres. Her immediate and remote memory, attention, language skills and fund of knowledge are appropriate. There is no evidence of aphasia, agnosia, apraxia or anomia. Speech is clear with normal prosody and enunciation. Thought process is linear. Mood is normal and affect is normal.  Cranial nerves II - XII are as described above under HEENT exam.  Motor exam: Normal bulk, strength and tone is noted. There is no tremor. Fine motor skills and coordination: grossly intact.  Cerebellar testing: No dysmetria or intention tremor. There is no truncal or gait ataxia.  Sensory exam: intact to light touch in the upper and lower extremities.  Gait, station and balance: She stands easily. No veering to one side is noted. No leaning to one side is noted. Posture is age-appropriate and stance is narrow based. Gait shows normal stride length and normal pace. No problems turning are noted.   Assessment and Plan:  In summary, AMINAT SHELBURNE is a very pleasant 59 y.o.-year old female with an underlying medical history of seizure disorder and obesity, who presents for reevaluation of her obstructive sleep apnea.  Her sleep disturbance is probably exacerbated by her shift work, she has had difficulty adjusting to her daytime sleep schedule.  She is currently titrating on Lamictal for a total of 3 seizures experienced.  She knows that she cannot drive currently.  She is advised to proceed with sleep study testing.  . I had a long chat with the patient about my findings and the diagnosis of OSA, its prognosis and treatment options. We talked about medical treatments, surgical interventions and non-pharmacological approaches. I explained in particular the risks and ramifications of  untreated moderate to severe OSA, especially with respect to developing cardiovascular disease down the Road, including congestive heart failure, difficult to treat hypertension, cardiac arrhythmias, or stroke. Even type 2 diabetes has, in part, been linked to untreated OSA. Symptoms of untreated OSA include daytime sleepiness, memory problems, mood irritability and  mood disorder such as depression and anxiety, lack of energy, as well as recurrent headaches, especially morning headaches. We talked about trying to maintain a healthy lifestyle in general, as well as the importance of weight control. We also talked about the importance of good sleep hygiene. I recommended the following at this time: sleep study.  I explained the sleep test procedure to the patient and also outlined possible surgical and non-surgical treatment options of OSA, including the use of a custom-made dental device (which would require a referral to a specialist dentist or oral surgeon), upper airway surgical options (which would involve a referral to an ENT surgeon). I also explained the CPAP treatment option to the patient, who indicated that she would be willing to try CPAP if the need arises. I explained the importance of being compliant with PAP treatment, not only for insurance purposes but primarily to improve Her symptoms, and for the patient's long term health benefit, including to reduce Her cardiovascular risks. I answered all her questions today and the patient was in agreement. I plan to see her back after the sleep study is completed and encouraged her to call with any interim questions, concerns, problems or updates.   Thank you very much for allowing me to participate in the care of this nice patient. If I can be of any further assistance to you please do not hesitate to talk to me.  Sincerely,   Star Age, MD, PhD

## 2020-01-28 NOTE — Patient Instructions (Signed)

## 2020-01-29 ENCOUNTER — Encounter: Payer: Self-pay | Admitting: Emergency Medicine

## 2020-01-29 ENCOUNTER — Telehealth: Payer: Self-pay | Admitting: *Deleted

## 2020-01-29 NOTE — Telephone Encounter (Signed)
Spoke to patient about FMLA ,  I check the message in the chart from [place] ([employer]). The message states [employer] sent the paper work for [facility] to [facility] and the forms are on the doctor's desk for review, completion and signature. I advised her to call nurse Jacobo Forest to speak to her and see if the provider has completed forms.

## 2020-01-29 NOTE — Telephone Encounter (Signed)
Please look into this. Thanks.

## 2020-01-30 ENCOUNTER — Telehealth: Payer: Self-pay | Admitting: Neurology

## 2020-01-30 NOTE — Telephone Encounter (Signed)
Pt called wanting to speak to the RN regarding her FMLA paper work and her upcoming EEG. Please call back when available.

## 2020-01-30 NOTE — Telephone Encounter (Signed)
Called patient who stated her EEG is Monday and she'll need her date of incapacity on FMLA papers to be extended with letter from MD. Pending EEG results she wants to go back to work on 02/16/2020, and she asks that the letter state this. Her current return to work date is 02/06/20. She reported feeling better.  I informed her Dr Leta Baptist is out of office until Monday, will get letter ready for his review , signature. She would like to pick it up [facility] after EEG. I advised if it's not ready our medical records dept can scan, e mail to her. Patient verbalized understanding, appreciation. Letter on MD's desk for review, signature next week.

## 2020-02-03 ENCOUNTER — Other Ambulatory Visit: Payer: Self-pay

## 2020-02-03 ENCOUNTER — Ambulatory Visit: Payer: BC Managed Care – PPO | Admitting: Diagnostic Neuroimaging

## 2020-02-03 DIAGNOSIS — R569 Unspecified convulsions: Secondary | ICD-10-CM | POA: Diagnosis not present

## 2020-02-03 NOTE — Telephone Encounter (Signed)
Letter signed, sent patient my chart to advise her.

## 2020-02-06 ENCOUNTER — Telehealth: Payer: Self-pay | Admitting: *Deleted

## 2020-02-06 DIAGNOSIS — R569 Unspecified convulsions: Secondary | ICD-10-CM

## 2020-02-06 NOTE — Procedures (Signed)
   [facility]  EEG (ELECTROENCEPHALOGRAM) REPORT   STUDY DATE: 02/03/20 PATIENT NAME: Jane Heath DOB: [birth year] MRN: [id number]  ORDERING CLINICIAN: Andrey Spearman, MD   TECHNOLOGIST: Babs Bertin  TECHNIQUE: Electroencephalogram was recorded utilizing standard 10-20 system of lead placement and reformatted into average and bipolar montages.  RECORDING TIME: 26 minutes  ACTIVATION: hyperventilation and photic stimulation  CLINICAL INFORMATION: 59 year old female with seizure-like activity.  FINDINGS: Posterior dominant background rhythms, which attenuate with eye opening, ranging 8-9 hertz and 10-20 microvolts. No focal, lateralizing, epileptiform activity or seizures are seen. Patient recorded in the awake and drowsy state.  Benign sharp transients noted at C3 and Cz during drowsiness.  EKG channel shows regular rhythm of 70-80 beats per minute.   IMPRESSION:   Normal EEG in awake and drowsy states.    INTERPRETING PHYSICIAN:  Penni Bombard, MD Certified in Neurology, Neurophysiology and Neuroimaging  [facility] [address], [place], [place] [address] [phone]

## 2020-02-06 NOTE — Telephone Encounter (Signed)
Patient called asking about the message I left her re: EEG results. I advised her I LVM on 7/15 re: MRI results. Her EEG was Monday; I advised it may take up to 2 weeks to get that result. Patient verbalized understanding, appreciation.

## 2020-02-10 ENCOUNTER — Other Ambulatory Visit (HOSPITAL_COMMUNITY): Payer: Self-pay | Admitting: Emergency Medicine

## 2020-02-10 DIAGNOSIS — Z1231 Encounter for screening mammogram for malignant neoplasm of breast: Secondary | ICD-10-CM

## 2020-02-10 MED ORDER — LAMOTRIGINE 100 MG PO TABS: ORAL_TABLET | ORAL | 2 refills | Status: DC

## 2020-02-10 NOTE — Addendum Note (Signed)
Addended by: Minna Antis on: 02/10/2020 10:13 AM   Modules accepted: Orders

## 2020-02-12 ENCOUNTER — Ambulatory Visit: Payer: BC Managed Care – PPO | Admitting: Emergency Medicine

## 2020-02-18 ENCOUNTER — Ambulatory Visit (INDEPENDENT_AMBULATORY_CARE_PROVIDER_SITE_OTHER): Payer: BC Managed Care – PPO | Admitting: Emergency Medicine

## 2020-02-18 ENCOUNTER — Encounter: Payer: Self-pay | Admitting: Emergency Medicine

## 2020-02-18 ENCOUNTER — Other Ambulatory Visit: Payer: Self-pay

## 2020-02-18 VITALS — BP 150/85 | HR 81 | Temp 98.7°F | Ht 63.0 in | Wt 189.4 lb

## 2020-02-18 DIAGNOSIS — R7303 Prediabetes: Secondary | ICD-10-CM

## 2020-02-18 DIAGNOSIS — Z1211 Encounter for screening for malignant neoplasm of colon: Secondary | ICD-10-CM

## 2020-02-18 DIAGNOSIS — Z8669 Personal history of other diseases of the nervous system and sense organs: Secondary | ICD-10-CM

## 2020-02-18 DIAGNOSIS — G40909 Epilepsy, unspecified, not intractable, without status epilepticus: Secondary | ICD-10-CM

## 2020-02-18 NOTE — Progress Notes (Signed)
Jane Heath 59 y.o.   Chief Complaint  Patient presents with  . Results    Pt stated that she is having trouble trying to get her words out which she noticed over the past 3 weeks. Pt also stated that she had an episode od seizure about a week ago at home.    HISTORY OF PRESENT ILLNESS: This is a 59 y.o. female with history of seizure disorder recently diagnosed and under the care of a neurologist, started on Lamictal 100 mg twice daily.  Work-up so far negative, included brain MRI and EEG.  Has history of sleep apnea and presently being reevaluated. Needs work note to return.  Presently working third shift but requesting change of shift. No other complaints or medical concerns today.  HPI   Prior to Admission medications   Medication Sig Start Date End Date Taking? Authorizing Provider  lamoTRIgine (LAMICTAL) 100 MG tablet Take 100 mg twice daily 02/10/20  Yes Penumalli, Vikram R, MD  metFORMIN (GLUCOPHAGE) 500 MG tablet Take 500 mg by mouth daily.  07/21/19  [provider]    No Known Allergies  Patient Active Problem List   Diagnosis Date Noted  . Seizure disorder ([place]) 01/02/2020  . Flu-like symptoms 06/06/2017  . Influenza due to identified novel influenza A virus with other respiratory manifestations 06/06/2017  . Laryngitis 06/06/2017    Past Medical History:  Diagnosis Date  . Bronchitis   . COVID-19 virus infection 07/2019  . Seizure ([place]) 06/2019   most recent 12/19/19    Past Surgical History:  Procedure Laterality Date  . ABDOMINAL HYSTERECTOMY     1987  . BREAST REDUCTION SURGERY  2004  . tummy tuck  2008    Social History   Socioeconomic History  . Marital status: Single    Spouse name: Not on file  . Number of children: 2  . Years of education: Not on file  . Highest education level: Not on file  Occupational History    Comment: third shift  Tobacco Use  . Smoking status: Never Smoker  . Smokeless tobacco: Never Used  Substance  and Sexual Activity  . Alcohol use: Yes    Comment: 2 x week  . Drug use: Never  . Sexual activity: Not on file  Other Topics Concern  . Not on file  Social History Narrative   Lives with mother.  Works for [employer].  Education college 2 yrs.  Divorced.  No children.  Caffeine 12-16 oz daily.   Social Determinants of Health   Financial Resource Strain:   . Difficulty of Paying Living Expenses:   Food Insecurity:   . Worried About Charity fundraiser in the Last Year:   . Arboriculturist in the Last Year:   Transportation Needs:   . Film/video editor (Medical):   Marland Kitchen Lack of Transportation (Non-Medical):   Physical Activity:   . Days of Exercise per Week:   . Minutes of Exercise per Session:   Stress:   . Feeling of Stress :   Social Connections:   . Frequency of Communication with Friends and Family:   . Frequency of Social Gatherings with Friends and Family:   . Attends Religious Services:   . Active Member of Clubs or Organizations:   . Attends Archivist Meetings:   Marland Kitchen Marital Status:   Intimate Partner Violence:   . Fear of Current or Ex-Partner:   . Emotionally Abused:   Marland Kitchen Physically Abused:   .  Sexually Abused:     Family History  Problem Relation Age of Onset  . Hypertension Mother   . Heart disease Mother   . Hypertension Sister   . Diabetes Brother   . Hypertension Brother   . Heart disease Maternal Grandmother   . Alzheimer's disease Other      Review of Systems  Constitutional: Negative.  Negative for chills and fever.  HENT: Negative.  Negative for congestion and sore throat.   Respiratory: Negative.  Negative for cough and shortness of breath.   Cardiovascular: Negative.  Negative for chest pain and palpitations.  Gastrointestinal: Negative.  Negative for abdominal pain, diarrhea, nausea and vomiting.  Genitourinary: Negative.  Negative for dysuria and hematuria.  Musculoskeletal: Negative.  Negative for back pain, myalgias and neck  pain.  Skin: Negative.  Negative for rash.  Neurological: Negative.  Negative for dizziness and headaches.  All other systems reviewed and are negative.  Today's Vitals   02/18/20 1010 02/18/20 1011  BP: (!) 148/81 (!) 150/85  Pulse: 81   Temp: 98.7 F (37.1 C)   TempSrc: Temporal   SpO2: 95%   Weight: 189 lb 6.4 oz (85.9 kg)   Height: 5\' 3"  (1.6 m)    Body mass index is 33.55 kg/m.   Physical Exam Vitals reviewed.  Constitutional:      Appearance: Normal appearance.  HENT:     Head: Normocephalic.  Eyes:     Extraocular Movements: Extraocular movements intact.     Pupils: Pupils are equal, round, and reactive to light.  Cardiovascular:     Rate and Rhythm: Normal rate and regular rhythm.  Pulmonary:     Effort: Pulmonary effort is normal.  Musculoskeletal:        General: Normal range of motion.     Cervical back: Normal range of motion and neck supple.  Skin:    General: Skin is warm and dry.     Capillary Refill: Capillary refill takes less than 2 seconds.  Neurological:     General: No focal deficit present.     Mental Status: She is alert and oriented to person, place, and time.  Psychiatric:        Mood and Affect: Mood normal.        Behavior: Behavior normal.      ASSESSMENT & PLAN: Clinically stable.  No medical concerns identified during this visit.  Office visit notes from neurologist reviewed as well has recent diagnostic tests and procedures.  Continue Lamictal 100 mg twice a day.  Follow-up with neurologist as scheduled. Able to return to work tomorrow with recommendation to change shift in order to avoid internal clock, circadian rhythm extreme variations. Jane Heath was seen today for results.  Diagnoses and all orders for this visit:  Seizure disorder ([place])  Prediabetes  History of sleep apnea  Screening for colon cancer -     Ambulatory referral to Gastroenterology     Patient Instructions       If you have lab work done today you  will be contacted with your lab results within the next 2 weeks.  If you have not heard from [place] then please contact us. The fastest way to get your results is to register for My Chart.   IF you received an x-ray today, you will receive an invoice from [facility]. Please contact [facility] at [phone] with questions or concerns regarding your invoice.   IF you received labwork today, you will receive an invoice from  [facility]. Please contact [facility] at [phone] with questions or concerns regarding your invoice.   Our billing staff will not be able to assist you with questions regarding bills from these companies.  You will be contacted with the lab results as soon as they are available. The fastest way to get your results is to activate your My Chart account. Instructions are located on the last page of this paperwork. If you have not heard from [place] regarding the results in 2 weeks, please contact this office.     Seizure, Adult A seizure is a sudden burst of abnormal electrical activity in the brain. Seizures usually last from 30 seconds to 2 minutes. They can cause many different symptoms. Usually, seizures are not harmful unless they last a long time. What are the causes? Common causes of this condition include:  Fever or infection.  Conditions that affect the brain, such as: ? A brain abnormality that you were born with. ? A brain or head injury. ? Bleeding in the brain. ? A tumor. ? Stroke. ? Brain disorders such as autism or cerebral palsy.  Low blood sugar.  Conditions that are passed from parent to child (are inherited).  Problems with substances, such as: ? Having a reaction to a drug or a medicine. ? Suddenly stopping the use of a substance (withdrawal). In some cases, the cause may not be known. A person who has repeated seizures over time without a clear cause has a condition called epilepsy. What increases the risk? You are more likely to  get this condition if you have:  A family history of epilepsy.  Had a seizure in the past.  A brain disorder.  A history of head injury, lack of oxygen at birth, or strokes. What are the signs or symptoms? There are many types of seizures. The symptoms vary depending on the type of seizure you have. Examples of symptoms during a seizure include:  Shaking (convulsions).  Stiffness in the body.  Passing out (losing consciousness).  Head nodding.  Staring.  Not responding to sound or touch.  Loss of bladder control and bowel control. Some people have symptoms right before and right after a seizure happens. Symptoms before a seizure may include:  Fear.  Worry (anxiety).  Feeling like you may vomit (nauseous).  Feeling like the room is spinning (vertigo).  Feeling like you saw or heard something before (dj vu).  Odd tastes or smells.  Changes in how you see. You may see flashing lights or spots. Symptoms after a seizure happens can include:  Confusion.  Sleepiness.  Headache.  Weakness on one side of the body. How is this treated? Most seizures will stop on their own in under 5 minutes. In these cases, no treatment is needed. Seizures that last longer than 5 minutes will usually need treatment. Treatment can include:  Medicines given through an IV tube.  Avoiding things that are known to cause your seizures. These can include medicines that you take for another condition.  Medicines to treat epilepsy.  Surgery to stop the seizures. This may be needed if medicines do not help. Follow these instructions at home: Medicines  Take over-the-counter and prescription medicines only as told by your doctor.  Do not eat or drink anything that may keep your medicine from working, such as alcohol. Activity  Do not do any activities that would be dangerous if you had another seizure, like driving or swimming. Wait until your doctor says it is safe for you  to do  them.  If you live in the [place] ask your local DMV (department of motor vehicles) when you can drive.  Get plenty of rest. Teaching others Teach friends and family what to do when you have a seizure. They should:  Lay you on the ground.  Protect your head and body.  Loosen any tight clothing around your neck.  Turn you on your side.  Not hold you down.  Not put anything into your mouth.  Know whether or not you need emergency care.  Stay with you until you are better.  General instructions  Contact your doctor each time you have a seizure.  Avoid anything that gives you seizures.  Keep a seizure diary. Write down: ? What you think caused each seizure. ? What you remember about each seizure.  Keep all follow-up visits as told by your doctor. This is important. Contact a doctor if:  You have another seizure.  You have seizures more often.  There is any change in what happens during your seizures.  You keep having seizures with treatment.  You have symptoms of being sick or having an infection. Get help right away if:  You have a seizure that: ? Lasts longer than 5 minutes. ? Is different than seizures you had before. ? Makes it harder to breathe. ? Happens after you hurt your head.  You have any of these symptoms after a seizure: ? Not being able to speak. ? Not being able to use a part of your body. ? Confusion. ? A bad headache.  You have two or more seizures in a row.  You do not wake up right after a seizure.  You get hurt during a seizure. These symptoms may be an emergency. Do not wait to see if the symptoms will go away. Get medical help right away. Call your local emergency services (911 in the [place] Do not drive yourself to the hospital. Summary  Seizures usually last from 30 seconds to 2 minutes. Usually, they are not harmful unless they last a long time.  Do not eat or drink anything that may keep your medicine from working, such as  alcohol.  Teach friends and family what to do when you have a seizure.  Contact your doctor each time you have a seizure. This information is not intended to replace advice given to you by your health care provider. Make sure you discuss any questions you have with your health care provider. Document Revised: 09/14/2018 Document Reviewed: 09/14/2018 Elsevier Patient Education  2020 Elsevier Inc.     [name], MD [facility]

## 2020-02-18 NOTE — Patient Instructions (Addendum)
   If you have lab work done today you will be contacted with your lab results within the next 2 weeks.  If you have not heard from us then please contact us. The fastest way to get your results is to register for My Chart.   IF you received an x-ray today, you will receive an invoice from [facility]. Please contact [facility] at [phone] with questions or concerns regarding your invoice.   IF you received labwork today, you will receive an invoice from [facility]. Please contact [facility] at [phone] with questions or concerns regarding your invoice.   Our billing staff will not be able to assist you with questions regarding bills from these companies.  You will be contacted with the lab results as soon as they are available. The fastest way to get your results is to activate your My Chart account. Instructions are located on the last page of this paperwork. If you have not heard from us regarding the results in 2 weeks, please contact this office.     Seizure, Adult A seizure is a sudden burst of abnormal electrical activity in the brain. Seizures usually last from 30 seconds to 2 minutes. They can cause many different symptoms. Usually, seizures are not harmful unless they last a long time. What are the causes? Common causes of this condition include:  Fever or infection.  Conditions that affect the brain, such as: ? A brain abnormality that you were born with. ? A brain or head injury. ? Bleeding in the brain. ? A tumor. ? Stroke. ? Brain disorders such as autism or cerebral palsy.  Low blood sugar.  Conditions that are passed from parent to child (are inherited).  Problems with substances, such as: ? Having a reaction to a drug or a medicine. ? Suddenly stopping the use of a substance (withdrawal). In some cases, the cause may not be known. A person who has repeated seizures over time without a clear cause has a condition called epilepsy. What  increases the risk? You are more likely to get this condition if you have:  A family history of epilepsy.  Had a seizure in the past.  A brain disorder.  A history of head injury, lack of oxygen at birth, or strokes. What are the signs or symptoms? There are many types of seizures. The symptoms vary depending on the type of seizure you have. Examples of symptoms during a seizure include:  Shaking (convulsions).  Stiffness in the body.  Passing out (losing consciousness).  Head nodding.  Staring.  Not responding to sound or touch.  Loss of bladder control and bowel control. Some people have symptoms right before and right after a seizure happens. Symptoms before a seizure may include:  Fear.  Worry (anxiety).  Feeling like you may vomit (nauseous).  Feeling like the room is spinning (vertigo).  Feeling like you saw or heard something before (dj vu).  Odd tastes or smells.  Changes in how you see. You may see flashing lights or spots. Symptoms after a seizure happens can include:  Confusion.  Sleepiness.  Headache.  Weakness on one side of the body. How is this treated? Most seizures will stop on their own in under 5 minutes. In these cases, no treatment is needed. Seizures that last longer than 5 minutes will usually need treatment. Treatment can include:  Medicines given through an IV tube.  Avoiding things that are known to cause your seizures. These can include medicines that   you take for another condition.  Medicines to treat epilepsy.  Surgery to stop the seizures. This may be needed if medicines do not help. Follow these instructions at home: Medicines  Take over-the-counter and prescription medicines only as told by your doctor.  Do not eat or drink anything that may keep your medicine from working, such as alcohol. Activity  Do not do any activities that would be dangerous if you had another seizure, like driving or swimming. Wait until  your doctor says it is safe for you to do them.  If you live in the [place] ask your local DMV (department of motor vehicles) when you can drive.  Get plenty of rest. Teaching others Teach friends and family what to do when you have a seizure. They should:  Lay you on the ground.  Protect your head and body.  Loosen any tight clothing around your neck.  Turn you on your side.  Not hold you down.  Not put anything into your mouth.  Know whether or not you need emergency care.  Stay with you until you are better.  General instructions  Contact your doctor each time you have a seizure.  Avoid anything that gives you seizures.  Keep a seizure diary. Write down: ? What you think caused each seizure. ? What you remember about each seizure.  Keep all follow-up visits as told by your doctor. This is important. Contact a doctor if:  You have another seizure.  You have seizures more often.  There is any change in what happens during your seizures.  You keep having seizures with treatment.  You have symptoms of being sick or having an infection. Get help right away if:  You have a seizure that: ? Lasts longer than 5 minutes. ? Is different than seizures you had before. ? Makes it harder to breathe. ? Happens after you hurt your head.  You have any of these symptoms after a seizure: ? Not being able to speak. ? Not being able to use a part of your body. ? Confusion. ? A bad headache.  You have two or more seizures in a row.  You do not wake up right after a seizure.  You get hurt during a seizure. These symptoms may be an emergency. Do not wait to see if the symptoms will go away. Get medical help right away. Call your local emergency services (911 in the [place] Do not drive yourself to the hospital. Summary  Seizures usually last from 30 seconds to 2 minutes. Usually, they are not harmful unless they last a long time.  Do not eat or drink anything that may  keep your medicine from working, such as alcohol.  Teach friends and family what to do when you have a seizure.  Contact your doctor each time you have a seizure. This information is not intended to replace advice given to you by your health care provider. Make sure you discuss any questions you have with your health care provider. Document Revised: 09/14/2018 Document Reviewed: 09/14/2018 Elsevier Patient Education  2020 Elsevier Inc.  

## 2020-02-19 ENCOUNTER — Ambulatory Visit (HOSPITAL_COMMUNITY)
Admission: RE | Admit: 2020-02-19 | Discharge: 2020-02-19 | Disposition: A | Discharge status: Home / Self Care | Payer: BC Managed Care – PPO | Source: Ambulatory Visit | Attending: Emergency Medicine | Admitting: Emergency Medicine

## 2020-02-19 ENCOUNTER — Encounter (HOSPITAL_COMMUNITY): Payer: Self-pay

## 2020-02-19 DIAGNOSIS — Z1231 Encounter for screening mammogram for malignant neoplasm of breast: Secondary | ICD-10-CM | POA: Diagnosis not present

## 2020-02-27 ENCOUNTER — Telehealth: Payer: Self-pay

## 2020-02-27 NOTE — Telephone Encounter (Signed)
LVM for pt to call me back to reschedule tomrw nights sleep study.

## 2020-03-03 ENCOUNTER — Inpatient Hospital Stay
Admission: RE | Admit: 2020-03-03 | Discharge: 2020-03-03 | Disposition: A | Discharge status: Home / Self Care | Payer: Self-pay | Source: Ambulatory Visit | Attending: Emergency Medicine | Admitting: Emergency Medicine

## 2020-03-03 ENCOUNTER — Other Ambulatory Visit (HOSPITAL_COMMUNITY): Payer: Self-pay | Admitting: Emergency Medicine

## 2020-03-03 DIAGNOSIS — Z1231 Encounter for screening mammogram for malignant neoplasm of breast: Secondary | ICD-10-CM

## 2020-03-05 ENCOUNTER — Telehealth: Payer: Self-pay | Admitting: *Deleted

## 2020-03-05 ENCOUNTER — Ambulatory Visit: Payer: BC Managed Care – PPO | Admitting: Emergency Medicine

## 2020-03-05 NOTE — Telephone Encounter (Signed)
Patient was here 02/18/2020, a letter to return to work 02/19/2020 with restrictions to work day shifts was given to patient.

## 2020-03-09 ENCOUNTER — Other Ambulatory Visit: Payer: Self-pay

## 2020-03-09 ENCOUNTER — Encounter: Payer: Self-pay | Admitting: Emergency Medicine

## 2020-03-09 DIAGNOSIS — Z1211 Encounter for screening for malignant neoplasm of colon: Secondary | ICD-10-CM

## 2020-03-11 ENCOUNTER — Encounter: Payer: Self-pay | Admitting: Internal Medicine

## 2020-03-19 ENCOUNTER — Other Ambulatory Visit: Payer: Self-pay

## 2020-03-19 ENCOUNTER — Ambulatory Visit (INDEPENDENT_AMBULATORY_CARE_PROVIDER_SITE_OTHER): Payer: Self-pay | Admitting: Neurology

## 2020-03-19 DIAGNOSIS — G4733 Obstructive sleep apnea (adult) (pediatric): Secondary | ICD-10-CM

## 2020-03-19 DIAGNOSIS — G4720 Circadian rhythm sleep disorder, unspecified type: Secondary | ICD-10-CM

## 2020-03-19 DIAGNOSIS — G40909 Epilepsy, unspecified, not intractable, without status epilepticus: Secondary | ICD-10-CM

## 2020-03-19 DIAGNOSIS — G4719 Other hypersomnia: Secondary | ICD-10-CM

## 2020-03-19 DIAGNOSIS — G4726 Circadian rhythm sleep disorder, shift work type: Secondary | ICD-10-CM

## 2020-03-19 DIAGNOSIS — R519 Headache, unspecified: Secondary | ICD-10-CM

## 2020-03-19 DIAGNOSIS — R351 Nocturia: Secondary | ICD-10-CM

## 2020-03-19 DIAGNOSIS — E669 Obesity, unspecified: Secondary | ICD-10-CM

## 2020-03-26 NOTE — Addendum Note (Signed)
Addended by: Star Age on: 03/26/2020 06:14 PM   Modules accepted: Orders

## 2020-03-26 NOTE — Procedures (Signed)
ldPATIENT'S NAME:  Jane Heath, Jane Heath DOB:      [birth year]      MR#:    [id number]     DATE OF RECORDING: 03/19/2020 REFERRING M.D.:  Andrey Spearman, MD Study Performed:   Baseline Polysomnogram HISTORY: 59 year old woman with a history of seizure disorder and obesity, who was previously diagnosed with obstructive sleep apnea and placed on positive airway pressure treatment. She is no longer on a CPAP machine, she could not tolerate treatment at the time. The patient endorsed the Epworth Sleepiness Scale at 13 points. The patient's weight 191 pounds with a height of 63 (inches), resulting in a BMI of 34. kg/m2. The patient's neck circumference measured 15 inches.  CURRENT MEDICATIONS: Lamictal, Xanax, Zyrtec, Flonase   PROCEDURE:  This is a multichannel digital polysomnogram utilizing the Somnostar 11.2 system.  Electrodes and sensors were applied and monitored per AASM Specifications.   EEG, EOG, Chin and Limb EMG, were sampled at 200 Hz.  ECG, Snore and Nasal Pressure, Thermal Airflow, Respiratory Effort, CPAP Flow and Pressure, Oximetry was sampled at 50 Hz. Digital video and audio were recorded.      BASELINE STUDY  Lights Out was at 21:05 and Lights On at 05:11.  Total recording time (TRT) was 486.5 minutes, with a total sleep time (TST) of 462 minutes.   The patient's sleep latency was 5.5 minutes.  REM latency was 193.5 minutes, which is delayed.  The sleep efficiency was 95. %.     SLEEP ARCHITECTURE: WASO (Wake after sleep onset) was 18.5 minutes.  There were 12.5 minutes in Stage N1, 328 minutes Stage N2, 76.5 minutes Stage N3 and 45 minutes in Stage REM.  The percentage of Stage N1 was 2.7%, Stage N2 was 71.%, which is increased, Stage N3 was 16.6%, which is normal, and Stage R (REM sleep) was 9.7%, which is reduced. The arousals were noted as: 41 were spontaneous, 0 were associated with PLMs, 15 were associated with respiratory events.  RESPIRATORY ANALYSIS:  There were a total of 65  respiratory events:  5 obstructive apneas, 1 central apneas and 1 mixed apneas with a total of 7 apneas and an apnea index (AI) of .9 /hour. There were 58 hypopneas with a hypopnea index of 7.5 /hour. The patient also had 0 respiratory event related arousals (RERAs).      The total APNEA/HYPOPNEA INDEX (AHI) was 8.4/hour and the total RESPIRATORY DISTURBANCE INDEX was  8.4 /hour.  8 events occurred in REM sleep and 102 events in NREM. The REM AHI was  10.7 /hour, versus a non-REM AHI of 8.2. The patient spent 100.5 minutes of total sleep time in the supine position and 362 minutes in non-supine.. The supine AHI was 6.6 versus a non-supine AHI of 9.0.  OXYGEN SATURATION & C02:  The Wake baseline 02 saturation was 96%, with the lowest being 81%. Time spent below 89% saturation equaled 61 minutes.    PERIODIC LIMB MOVEMENTS: The patient had a total of 0 Periodic Limb Movements.  The Periodic Limb Movement (PLM) index was 0 and the PLM Arousal index was 0/hour.  Audio and video analysis did not show any abnormal or unusual movements, behaviors, phonations or vocalizations. The patient took one bathroom break. Snoring was noted, ranging from mild to loud. The EKG was in keeping with normal sinus rhythm (NSR).  Post-study, the patient indicated that sleep was the same as usual.   IMPRESSION:  1. Obstructive Sleep Apnea (OSA) 2. Dysfunctions associated with sleep stages or  arousal from sleep  RECOMMENDATIONS:  1. This study demonstrates overall mild obstructive sleep apnea, with a total AHI of 8.4/h, REM AHI of 10.7/h, supine AHI of 6.6/h, and O2 nadir of 81%.  The absence of supine REM sleep may underestimate her AHI and O2 nadir.  Given her medical history and sleep related complaints, treatment with positive airway pressure can be considered.  Treatment can be achieved in the form of AutoPap therapy at home. A full-night CPAP titration study would allow optimization of therapy, if needed. Other  treatment options may include avoidance of supine sleep position along with weight loss, upper airway or jaw surgery in selected patients or the use of an oral appliance in certain patients. ENT evaluation and/or consultation with a maxillofacial surgeon or dentist may be feasible in some instances.    2. Please note that untreated obstructive sleep apnea may carry additional perioperative morbidity. Patients with significant obstructive sleep apnea should receive perioperative PAP therapy and the surgeons and particularly the anesthesiologist should be informed of the diagnosis and the severity of the sleep disordered breathing. 3. This study shows some sleep fragmentation and abnormal sleep stage percentages; these are nonspecific findings and per se do not signify an intrinsic sleep disorder or a cause for the patient's sleep-related symptoms. Causes include (but are not limited to) the first night effect of the sleep study, circadian rhythm disturbances, medication effect or an underlying mood disorder or medical problem.  4. The patient should be cautioned not to drive, work at heights, or operate dangerous or heavy equipment when tired or sleepy. Review and reiteration of good sleep hygiene measures should be pursued with any patient. 5. The patient will be seen in follow-up by Dr. Rexene Alberts at [facility] for discussion of the test results and further management strategies. The referring provider will be notified of the test results.  I certify that I have reviewed the entire raw data recording prior to the issuance of this report in accordance with the Standards of Accreditation of the American Academy of Sleep Medicine (AASM)   Star Age, MD, PhD Diplomat, American Board of Neurology and Sleep Medicine (Neurology and Sleep Medicine)

## 2020-03-26 NOTE — Progress Notes (Signed)
Patient referred by Dr. Leta Baptist for re-eval of her OSA, seen by me on 01/28/20, diagnostic PSG on 03/19/20.    Please call and notify the patient that the recent sleep study did confirm the diagnosis of obstructive sleep apnea. OSA is overall mild, but worth treating to see if she feels better after treatment. To that end I recommend treatment for this in the form of autoPAP, which means, that we don't have to bring her back for a second sleep study with CPAP, but will let him try an autoPAP machine at home, through a DME company (of her choice, or as per insurance requirement). The DME representative will educate her on how to use the machine, how to put the mask on, etc. I have placed an order in the chart. Please send referral, talk to patient, send report to referring MD. We will need a FU in sleep clinic for 10 weeks post-PAP set up, please arrange that with me or one of our NPs. Thanks,   Star Age, MD, PhD [facility])

## 2020-03-31 ENCOUNTER — Telehealth: Payer: Self-pay

## 2020-03-31 NOTE — Telephone Encounter (Signed)
Patient is returning your call. She can be reached at [phone].

## 2020-03-31 NOTE — Telephone Encounter (Signed)
-----   Message from Star Age, MD sent at 03/26/2020  6:14 PM EDT ----- Patient referred by Dr. Leta Baptist for re-eval of her OSA, seen by me on 01/28/20, diagnostic PSG on 03/19/20.    Please call and notify the patient that the recent sleep study did confirm the diagnosis of obstructive sleep apnea. OSA is overall mild, but worth treating to see if she feels better after treatment. To that end I recommend treatment for this in the form of autoPAP, which means, that we don't have to bring her back for a second sleep study with CPAP, but will let him try an autoPAP machine at home, through a DME company (of her choice, or as per insurance requirement). The DME representative will educate her on how to use the machine, how to put the mask on, etc. I have placed an order in the chart. Please send referral, talk to patient, send report to referring MD. We will need a FU in sleep clinic for 10 weeks post-PAP set up, please arrange that with me or one of our NPs. Thanks,   Star Age, MD, PhD [facility])

## 2020-03-31 NOTE — Telephone Encounter (Signed)
I called pt. No answer, left a message asking pt to call me back.   

## 2020-04-02 NOTE — Telephone Encounter (Signed)
I called pt. I advised pt that Dr. Rexene Alberts reviewed their sleep study results and found that pt has mild osa . Dr. Rexene Alberts recommends that pt start an autopap for treatment. I reviewed PAP compliance expectations with the pt. Pt is agreeable to starting an auto-PAP. I advised pt that an order will be sent to a DME, [facility], and [facility] will call the pt within about one week after they file with the pt's insurance. [facility] will show the pt how to use the machine, fit for masks, and troubleshoot the auto-PAP if needed. A follow up appt was made for insurance purposes with Amy, NP on 07/15/2019 at 930 am . Pt verbalized understanding to arrive 15 minutes early and bring their auto-PAP. A letter with all of this information in it will be mailed to the pt as a reminder. I verified with the pt that the address we have on file is correct. Pt verbalized understanding of results. Pt had no questions at this time but was encouraged to call back if questions arise. I have sent the order to [facility] and have received confirmation that they have received the order.

## 2020-04-29 ENCOUNTER — Encounter: Payer: Self-pay | Admitting: Gastroenterology

## 2020-04-29 NOTE — Progress Notes (Deleted)
Referring Provider: Horald Pollen, MD Primary Care Physician:  Horald Pollen, MD Primary Gastroenterologist:  Dr. Rayne Du chief complaint on file.   HPI:   Jane Heath is a 59 y.o. female presenting today at the request of Sagardia, Ines Bloomer, MD for colon cancer screening. Recommended OV due to recent diagnosis of seizures.   Negative MRI brain and EEG. On Lamictal.   Today:   Past Medical History:  Diagnosis Date  . Bronchitis   . COVID-19 virus infection 07/2019  . OSA (obstructive sleep apnea)   . Seizure ([place]) 06/2019   most recent 12/19/19    Past Surgical History:  Procedure Laterality Date  . ABDOMINAL HYSTERECTOMY     1987  . BREAST REDUCTION SURGERY  2004  . REDUCTION MAMMAPLASTY Bilateral   . tummy tuck  2008    Current Outpatient Medications  Medication Sig Dispense Refill  . lamoTRIgine (LAMICTAL) 100 MG tablet Take 100 mg twice daily 180 tablet 2   No current facility-administered medications for this visit.    Allergies as of 04/30/2020  . (No Known Allergies)    Family History  Problem Relation Age of Onset  . Hypertension Mother   . Heart disease Mother   . Hypertension Sister   . Diabetes Brother   . Hypertension Brother   . Heart disease Maternal Grandmother   . Alzheimer's disease Other     Social History   Socioeconomic History  . Marital status: Single    Spouse name: Not on file  . Number of children: 2  . Years of education: Not on file  . Highest education level: Not on file  Occupational History    Comment: third shift  Tobacco Use  . Smoking status: Never Smoker  . Smokeless tobacco: Never Used  Substance and Sexual Activity  . Alcohol use: Yes    Comment: 2 x week  . Drug use: Never  . Sexual activity: Not on file  Other Topics Concern  . Not on file  Social History Narrative   Lives with mother.  Works for [employer].  Education college 2 yrs.  Divorced.  No children.  Caffeine 12-16 oz  daily.   Social Determinants of Health   Financial Resource Strain:   . Difficulty of Paying Living Expenses: Not on file  Food Insecurity:   . Worried About Charity fundraiser in the Last Year: Not on file  . Ran Out of Food in the Last Year: Not on file  Transportation Needs:   . Lack of Transportation (Medical): Not on file  . Lack of Transportation (Non-Medical): Not on file  Physical Activity:   . Days of Exercise per Week: Not on file  . Minutes of Exercise per Session: Not on file  Stress:   . Feeling of Stress : Not on file  Social Connections:   . Frequency of Communication with Friends and Family: Not on file  . Frequency of Social Gatherings with Friends and Family: Not on file  . Attends Religious Services: Not on file  . Active Member of Clubs or Organizations: Not on file  . Attends Archivist Meetings: Not on file  . Marital Status: Not on file  Intimate Partner Violence:   . Fear of Current or Ex-Partner: Not on file  . Emotionally Abused: Not on file  . Physically Abused: Not on file  . Sexually Abused: Not on file    Review of Systems: Gen: Denies any  fever, chills, fatigue, weight loss, lack of appetite.  CV: Denies chest pain, heart palpitations, peripheral edema, syncope.  Resp: Denies shortness of breath at rest or with exertion. Denies wheezing or cough.  GI: Denies dysphagia or odynophagia. Denies jaundice, hematemesis, fecal incontinence. GU : Denies urinary burning, urinary frequency, urinary hesitancy MS: Denies joint pain, muscle weakness, cramps, or limitation of movement.  Derm: Denies rash, itching, dry skin Psych: Denies depression, anxiety, memory loss, and confusion Heme: Denies bruising, bleeding, and enlarged lymph nodes.  Physical Exam: There were no vitals taken for this visit. General:   Alert and oriented. Pleasant and cooperative. Well-nourished and well-developed.  Head:  Normocephalic and atraumatic. Eyes:  Without  icterus, sclera clear and conjunctiva pink.  Ears:  Normal auditory acuity. Nose:  No deformity, discharge,  or lesions. Mouth:  No deformity or lesions, oral mucosa pink.  Neck:  Supple, without mass or thyromegaly. Lungs:  Clear to auscultation bilaterally. No wheezes, rales, or rhonchi. No distress.  Heart:  S1, S2 present without murmurs appreciated.  Abdomen:  +BS, soft, non-tender and non-distended. No HSM noted. No guarding or rebound. No masses appreciated.  Rectal:  Deferred  Msk:  Symmetrical without gross deformities. Normal posture. Pulses:  Normal pulses noted. Extremities:  Without clubbing or edema. Neurologic:  Alert and  oriented x4;  grossly normal neurologically. Skin:  Intact without significant lesions or rashes. Cervical Nodes:  No significant cervical adenopathy. Psych:  Alert and cooperative. Normal mood and affect.

## 2020-04-30 ENCOUNTER — Ambulatory Visit
Admission: EM | Admit: 2020-04-30 | Discharge: 2020-04-30 | Discharge status: Absent without leave | Payer: BC Managed Care – PPO

## 2020-04-30 ENCOUNTER — Ambulatory Visit: Payer: Self-pay | Admitting: Gastroenterology

## 2020-04-30 ENCOUNTER — Other Ambulatory Visit: Payer: Self-pay

## 2020-05-01 ENCOUNTER — Telehealth: Payer: Self-pay | Admitting: Emergency Medicine

## 2020-05-01 DIAGNOSIS — L03119 Cellulitis of unspecified part of limb: Secondary | ICD-10-CM | POA: Diagnosis not present

## 2020-05-01 DIAGNOSIS — M25572 Pain in left ankle and joints of left foot: Secondary | ICD-10-CM | POA: Diagnosis not present

## 2020-05-01 NOTE — Telephone Encounter (Signed)
Referral Followup °

## 2020-05-03 ENCOUNTER — Other Ambulatory Visit: Payer: Self-pay

## 2020-05-03 ENCOUNTER — Emergency Department (HOSPITAL_COMMUNITY)
Admission: EM | Admit: 2020-05-03 | Discharge: 2020-05-03 | Disposition: A | Discharge status: Home / Self Care | Payer: BC Managed Care – PPO | Attending: Emergency Medicine | Admitting: Emergency Medicine

## 2020-05-03 DIAGNOSIS — R0902 Hypoxemia: Secondary | ICD-10-CM | POA: Diagnosis not present

## 2020-05-03 DIAGNOSIS — R569 Unspecified convulsions: Secondary | ICD-10-CM | POA: Diagnosis not present

## 2020-05-03 DIAGNOSIS — R404 Transient alteration of awareness: Secondary | ICD-10-CM | POA: Diagnosis not present

## 2020-05-03 DIAGNOSIS — G40909 Epilepsy, unspecified, not intractable, without status epilepticus: Secondary | ICD-10-CM | POA: Insufficient documentation

## 2020-05-03 DIAGNOSIS — Z8616 Personal history of COVID-19: Secondary | ICD-10-CM | POA: Insufficient documentation

## 2020-05-03 DIAGNOSIS — G40901 Epilepsy, unspecified, not intractable, with status epilepticus: Secondary | ICD-10-CM | POA: Diagnosis not present

## 2020-05-03 LAB — CBC
HCT: 41 % (ref 36.0–46.0)
Hemoglobin: 12.8 g/dL (ref 12.0–15.0)
MCH: 27.9 pg (ref 26.0–34.0)
MCHC: 31.2 g/dL (ref 30.0–36.0)
MCV: 89.3 fL (ref 80.0–100.0)
Platelets: 275 10*3/uL (ref 150–400)
RBC: 4.59 MIL/uL (ref 3.87–5.11)
RDW: 12.9 % (ref 11.5–15.5)
WBC: 8.5 10*3/uL (ref 4.0–10.5)
nRBC: 0 % (ref 0.0–0.2)

## 2020-05-03 LAB — COMPREHENSIVE METABOLIC PANEL
ALT: 21 U/L (ref 0–44)
AST: 29 U/L (ref 15–41)
Albumin: 4.4 g/dL (ref 3.5–5.0)
Alkaline Phosphatase: 50 U/L (ref 38–126)
Anion gap: 10 (ref 5–15)
BUN: 14 mg/dL (ref 6–20)
CO2: 22 mmol/L (ref 22–32)
Calcium: 9.1 mg/dL (ref 8.9–10.3)
Chloride: 99 mmol/L (ref 98–111)
Creatinine, Ser: 0.87 mg/dL (ref 0.44–1.00)
GFR, Estimated: >60 mL/min (ref >60)
Glucose, Bld: 115 mg/dL — ABNORMAL HIGH (ref 70–99)
Potassium: 3.6 mmol/L (ref 3.5–5.1)
Sodium: 131 mmol/L — ABNORMAL LOW (ref 135–145)
Total Bilirubin: 0.5 mg/dL (ref 0.3–1.2)
Total Protein: 7.4 g/dL (ref 6.5–8.1)

## 2020-05-03 LAB — CBG MONITORING, ED: Glucose-Capillary: 110 mg/dL — ABNORMAL HIGH (ref 70–99)

## 2020-05-03 LAB — RAPID URINE DRUG SCREEN, HOSP PERFORMED
Amphetamines: NOT DETECTED
Barbiturates: NOT DETECTED
Benzodiazepines: NOT DETECTED
Cocaine: NOT DETECTED
Opiates: NOT DETECTED
Tetrahydrocannabinol: NOT DETECTED

## 2020-05-03 LAB — ETHANOL: Alcohol, Ethyl (B): <10 mg/dL (ref <10)

## 2020-05-03 MED ORDER — LORAZEPAM 1 MG PO TABS
1.0000 mg | ORAL_TABLET | Freq: Once | ORAL | Status: AC
  Administered 2020-05-03: 1 mg via ORAL
  Filled 2020-05-03: qty 1

## 2020-05-03 NOTE — ED Provider Notes (Signed)
[facility] EMERGENCY DEPARTMENT Provider Note   CSN: [id number] Arrival date & time: 05/03/20  4128     History No chief complaint on file.   Jane Heath is a 59 y.o. female.  HPI   This patient is a 59 year old female, review of the medical record by myself shows that this patient has a history of seizures, she has been seen by neurology multiple times in the past, initially for memory loss in 2019 at which time she underwent an MRI of the brain which was unremarkable, she then had seizure-like activity extending from the beginning of 2021 through June, she followed up in June during which time an EEG was ordered and was unremarkable and a CT scan was obtained after an ER visit for seizure-like activity which was also unremarkable.  Because of a prior history of depression and anxiety the patient had been placed on Lamictal to help not only with seizures but with mood stabilizing effects.  Evidently the patient has been compliant with that medication, she has also been prescribed Xanax and states that she drinks the occasional drink of wine.  Paramedics report that today the patient was seen to have seizure-like activity in her bed, this stopped spontaneously, she was noted to have some blood on the left side of her shirt and had been biting her tongue with a tongue injury.  She was confused and postictal in route to the hospital, no medications were administered and on the time of arrival the patient is now speaking, has no memory of the event and is frustrated by her inability to recall the names of her medications.  She has no headache, has had no recent injuries, has not been increasing or decreasing the amount of alcohol that she takes but thinks that she may have missed her medications for several days.  She attributes this to not wanting to mix her medications with an antibiotic that she was prescribed for her left shin which was recently injured during a fall downstairs (did not hit  her head), and for which she feels is getting better.  I see no related visits in the medical record regarding this.  Blood sugar was not measured prehospital  Past Medical History:  Diagnosis Date  . Bronchitis   . COVID-19 virus infection 07/2019  . OSA (obstructive sleep apnea)   . Seizure ([place]) 06/2019   most recent 12/19/19    Patient Active Problem List   Diagnosis Date Noted  . Seizure disorder ([place]) 01/02/2020  . Flu-like symptoms 06/06/2017  . Influenza due to identified novel influenza A virus with other respiratory manifestations 06/06/2017  . Laryngitis 06/06/2017    Past Surgical History:  Procedure Laterality Date  . ABDOMINAL HYSTERECTOMY     1987  . BREAST REDUCTION SURGERY  2004  . REDUCTION MAMMAPLASTY Bilateral   . tummy tuck  2008     OB History   No obstetric history on file.     Family History  Problem Relation Age of Onset  . Hypertension Mother   . Heart disease Mother   . Hypertension Sister   . Diabetes Brother   . Hypertension Brother   . Heart disease Maternal Grandmother   . Alzheimer's disease Other     Social History   Tobacco Use  . Smoking status: Never Smoker  . Smokeless tobacco: Never Used  Substance Use Topics  . Alcohol use: Yes    Comment: 2 x week  . Drug use: Never  Home Medications Prior to Admission medications   Medication Sig Start Date End Date Taking? Authorizing Provider  lamoTRIgine (LAMICTAL) 100 MG tablet Take 100 mg twice daily 02/10/20   Penumalli, Earlean Polka, MD  metFORMIN (GLUCOPHAGE) 500 MG tablet Take 500 mg by mouth daily.  07/21/19  [provider]    Allergies    Patient has no known allergies.  Review of Systems   Review of Systems  Constitutional: Negative for fever.  HENT: Negative for dental problem.        Tongue bite  Eyes: Negative for photophobia, redness and visual disturbance.  Respiratory: Negative for cough and shortness of breath.   Cardiovascular: Negative for chest  pain and leg swelling.  Gastrointestinal: Negative for diarrhea, nausea and vomiting.  Genitourinary: Negative for dysuria and flank pain.  Musculoskeletal: Negative for back pain and neck pain.  Skin: Positive for rash.  Neurological: Positive for seizures. Negative for weakness and headaches.  Hematological: Negative for adenopathy.  Psychiatric/Behavioral: Positive for confusion.  All other systems reviewed and are negative.   Physical Exam Updated Vital Signs BP (!) 146/82   Pulse 71   Temp 97.9 F (36.6 C) (Oral)   Resp 15   SpO2 97%   Physical Exam Vitals and nursing note reviewed.  Constitutional:      General: She is not in acute distress.    Appearance: She is well-developed.  HENT:     Head: Normocephalic and atraumatic.     Mouth/Throat:     Pharynx: No oropharyngeal exudate.     Comments: Superficial injury to the right side of the tongue, dentition is intact, no trismus or torticollis, moist mucous membranes Eyes:     General: No scleral icterus.       Right eye: No discharge.        Left eye: No discharge.     Conjunctiva/sclera: Conjunctivae normal.     Pupils: Pupils are equal, round, and reactive to light.  Neck:     Thyroid: No thyromegaly.     Vascular: No JVD.  Cardiovascular:     Rate and Rhythm: Normal rate and regular rhythm.     Heart sounds: Normal heart sounds. No murmur heard.  No friction rub. No gallop.   Pulmonary:     Effort: Pulmonary effort is normal. No respiratory distress.     Breath sounds: Normal breath sounds. No wheezing or rales.  Abdominal:     General: Bowel sounds are normal. There is no distension.     Palpations: Abdomen is soft. There is no mass.     Tenderness: There is no abdominal tenderness.  Musculoskeletal:        General: No tenderness. Normal range of motion.     Cervical back: Normal range of motion and neck supple.  Lymphadenopathy:     Cervical: No cervical adenopathy.  Skin:    General: Skin is warm and  dry.     Findings: No erythema or rash.  Neurological:     Mental Status: She is alert.     Coordination: Coordination normal.     Comments: The patient is now awake alert and able to follow commands with all 4 extremities, she knows where she is, she knows the date and her name, she has normal finger-nose-finger, normal grips, normal cranial nerves III through XII.  Psychiatric:        Behavior: Behavior normal.     Comments: The patient is tearful and frustrated at her inability to  recall information but not responding to internal stimuli, denies significant substance abuse     ED Results / Procedures / Treatments   Labs (all labs ordered are listed, but only abnormal results are displayed) Labs Reviewed  COMPREHENSIVE METABOLIC PANEL - Abnormal; Notable for the following components:      Result Value   Sodium 131 (*)    Glucose, Bld 115 (*)    All other components within normal limits  CBG MONITORING, ED - Abnormal; Notable for the following components:   Glucose-Capillary 110 (*)    All other components within normal limits  CBC  ETHANOL  LAMOTRIGINE LEVEL  RAPID URINE DRUG SCREEN, HOSP PERFORMED    EKG EKG Interpretation  Date/Time:  Sunday May 03 2020 06:50:00 EDT Ventricular Rate:  88 PR Interval:    QRS Duration: 104 QT Interval:  363 QTC Calculation: 440 R Axis:   6 Text Interpretation: Sinus rhythm Abnormal R-wave progression, early transition Borderline repolarization abnormality Confirmed by Noemi Chapel [phone]) on 05/03/2020 7:10:24 AM   Radiology No results found.  Procedures Procedures (including critical care time)  Medications Ordered in ED Medications  LORazepam (ATIVAN) tablet 1 mg (1 mg Oral Given 05/03/20 8099)    ED Course  I have reviewed the triage vital signs and the nursing notes.  Pertinent labs & imaging results that were available during my care of the patient were reviewed by me and considered in my medical decision making  (see chart for details).  Clinical Course as of May 03 1006  Sun May 03, 2020  0712 Attempted to call family member, no response by phone   [BM]    Clinical Course User Index [BM] Noemi Chapel, MD   MDM Rules/Calculators/A&P                           This patient's blood sugar is 110, the EKG is unremarkable, she appears to have recurrent seizure activity which may or may not be related to not taking medications at an underlying epileptiform disorder.  She has had normal EEG and MRI as well as CT scan within the last couple of years and I do not think that advanced neuro imaging is appropriate at this time.  She will be given a dose of Ativan, she will be placed on the cardiac monitor, I will check electrolytes, I will need to further investigate her compliance with her medications to make sure this is not related to benzodiazepine withdrawal either as she does have a history of Xanax in the medical record though she cannot recall the name of this medication.  The patient has a minimal hyponatremia, this would not be causative of seizures most likely.  She has been observed for over 2 hours and has returned to her normal baseline mental status.  There is a family member in the room with her as well.  They feel comfortable going home, she will be given a work note for the next 2 days, encouraged to take her Lamictal as she states that she has not been taking it because she wanted to get the other medicine into her system first.  Her leg is feeling better and looking better and she is comfortable with follow-up in the outpatient setting.  Without a leukocytosis or a fever I doubt that the minimal redness on her leg is related to a worsening infection but more likely just inflammation.  Patient agreeable and stable for discharge  Final  Clinical Impression(s) / ED Diagnoses Final diagnoses:  Seizure [facility])    Rx / [place] Orders ED Discharge Orders    None       Noemi Chapel, MD 05/03/20  1007

## 2020-05-03 NOTE — Discharge Instructions (Signed)
Please make sure that you are taking your medication exactly as prescribed, do not stop taking the seizure medication as this may cause you to have another seizure.  Please return to the emergency department for any severe worsening symptoms otherwise follow-up with your doctor in 1 week

## 2020-05-03 NOTE — ED Triage Notes (Signed)
Per EMS, pt mom called out for pt convulsing in bed lasting approx 5 minutes, with incontinence noted. Pt has a history of one other occurrence of seizures around 6 months ago. EMS reports small bite to right side of tongue with bleeding controlled. Pt post-ictal at this time but is oriented to person and place but not situation

## 2020-05-04 LAB — LAMOTRIGINE LEVEL: Lamotrigine Lvl: <1 ug/mL — ABNORMAL LOW (ref 2.0–20.0)

## 2020-05-05 ENCOUNTER — Ambulatory Visit: Payer: Self-pay | Admitting: Emergency Medicine

## 2020-05-06 ENCOUNTER — Encounter: Payer: Self-pay | Admitting: Emergency Medicine

## 2020-05-11 ENCOUNTER — Ambulatory Visit: Payer: BC Managed Care – PPO | Admitting: Family Medicine

## 2020-05-11 ENCOUNTER — Encounter: Payer: Self-pay | Admitting: Family Medicine

## 2020-05-11 VITALS — BP 153/90 | HR 72 | Ht 61.0 in | Wt 183.0 lb

## 2020-05-11 DIAGNOSIS — E871 Hypo-osmolality and hyponatremia: Secondary | ICD-10-CM | POA: Diagnosis not present

## 2020-05-11 DIAGNOSIS — G40909 Epilepsy, unspecified, not intractable, without status epilepticus: Secondary | ICD-10-CM | POA: Diagnosis not present

## 2020-05-11 NOTE — Patient Instructions (Addendum)
Below is our plan:  We will continue lamotrigine 100mg  twice daily. Please try to avoid missing any doses of anti seizure medication. Please reach out to me if you have any questions and concerns.   Please make sure you are staying well hydrated. I recommend 50-60 ounces daily. Well balanced diet and regular exercise encouraged.    Please continue follow up with care team as directed.   Follow up with me in 3-4 months   You may receive a survey regarding today's visit. I encourage you to leave honest feed back as I do use this information to improve patient care. Thank you for seeing me today!      According to [place] law, you can not drive unless you are seizure / syncope free for at least 6 months and under physician's care.  Please maintain precautions. Do not participate in activities where a loss of awareness could harm you or someone else. No swimming alone, no tub bathing, no hot tubs, no driving, no operating motorized vehicles (cars, ATVs, motocycles, etc), lawnmowers, power tools or firearms. No standing at heights, such as rooftops, ladders or stairs. Avoid hot objects such as stoves, heaters, open fires. Wear a helmet when riding a bicycle, scooter, skateboard, etc. and avoid areas of traffic. Set your water heater to 120 degrees or less.    Sleep Apnea Sleep apnea affects breathing during sleep. It causes breathing to stop for a short time or to become shallow. It can also increase the risk of:  Heart attack.  Stroke.  Being very overweight (obese).  Diabetes.  Heart failure.  Irregular heartbeat. The goal of treatment is to help you breathe normally again. What are the causes? There are three kinds of sleep apnea:  Obstructive sleep apnea. This is caused by a blocked or collapsed airway.  Central sleep apnea. This happens when the brain does not send the right signals to the muscles that control breathing.  Mixed sleep apnea. This is a combination of  obstructive and central sleep apnea. The most common cause of this condition is a collapsed or blocked airway. This can happen if:  Your throat muscles are too relaxed.  Your tongue and tonsils are too large.  You are overweight.  Your airway is too small. What increases the risk?  Being overweight.  Smoking.  Having a small airway.  Being older.  Being female.  Drinking alcohol.  Taking medicines to calm yourself (sedatives or tranquilizers).  Having family members with the condition. What are the signs or symptoms?  Trouble staying asleep.  Being sleepy or tired during the day.  Getting angry a lot.  Loud snoring.  Headaches in the morning.  Not being able to focus your mind (concentrate).  Forgetting things.  Less interest in sex.  Mood swings.  Personality changes.  Feelings of sadness (depression).  Waking up a lot during the night to pee (urinate).  Dry mouth.  Sore throat. How is this diagnosed?  Your medical history.  A physical exam.  A test that is done when you are sleeping (sleep study). The test is most often done in a sleep lab but may also be done at home. How is this treated?   Sleeping on your side.  Using a medicine to get rid of mucus in your nose (decongestant).  Avoiding the use of alcohol, medicines to help you relax, or certain pain medicines (narcotics).  Losing weight, if needed.  Changing your diet.  Not smoking.  Using  a machine to open your airway while you sleep, such as: ? An oral appliance. This is a mouthpiece that shifts your lower jaw forward. ? A CPAP device. This device blows air through a mask when you breathe out (exhale). ? An EPAP device. This has valves that you put in each nostril. ? A BPAP device. This device blows air through a mask when you breathe in (inhale) and breathe out.  Having surgery if other treatments do not work. It is important to get treatment for sleep apnea. Without  treatment, it can lead to:  High blood pressure.  Coronary artery disease.  In men, not being able to have an erection (impotence).  Reduced thinking ability. Follow these instructions at home: Lifestyle  Make changes that your doctor recommends.  Eat a healthy diet.  Lose weight if needed.  Avoid alcohol, medicines to help you relax, and some pain medicines.  Do not use any products that contain nicotine or tobacco, such as cigarettes, e-cigarettes, and chewing tobacco. If you need help quitting, ask your doctor. General instructions  Take over-the-counter and prescription medicines only as told by your doctor.  If you were given a machine to use while you sleep, use it only as told by your doctor.  If you are having surgery, make sure to tell your doctor you have sleep apnea. You may need to bring your device with you.  Keep all follow-up visits as told by your doctor. This is important. Contact a doctor if:  The machine that you were given to use during sleep bothers you or does not seem to be working.  You do not get better.  You get worse. Get help right away if:  Your chest hurts.  You have trouble breathing in enough air.  You have an uncomfortable feeling in your back, arms, or stomach.  You have trouble talking.  One side of your body feels weak.  A part of your face is hanging down. These symptoms may be an emergency. Do not wait to see if the symptoms will go away. Get medical help right away. Call your local emergency services (911 in the [place] Do not drive yourself to the hospital. Summary  This condition affects breathing during sleep.  The most common cause is a collapsed or blocked airway.  The goal of treatment is to help you breathe normally while you sleep. This information is not intended to replace advice given to you by your health care provider. Make sure you discuss any questions you have with your health care provider. Document  Revised: 04/13/2018 Document Reviewed: 02/20/2018 Elsevier Patient Education  [place].   Seizure, Adult A seizure is a sudden burst of abnormal electrical activity in the brain. Seizures usually last from 30 seconds to 2 minutes. They can cause many different symptoms. Usually, seizures are not harmful unless they last a long time. What are the causes? Common causes of this condition include:  Fever or infection.  Conditions that affect the brain, such as: ? A brain abnormality that you were born with. ? A brain or head injury. ? Bleeding in the brain. ? A tumor. ? Stroke. ? Brain disorders such as autism or cerebral palsy.  Low blood sugar.  Conditions that are passed from parent to child (are inherited).  Problems with substances, such as: ? Having a reaction to a drug or a medicine. ? Suddenly stopping the use of a substance (withdrawal). In some cases, the cause may not be  known. A person who has repeated seizures over time without a clear cause has a condition called epilepsy. What increases the risk? You are more likely to get this condition if you have:  A family history of epilepsy.  Had a seizure in the past.  A brain disorder.  A history of head injury, lack of oxygen at birth, or strokes. What are the signs or symptoms? There are many types of seizures. The symptoms vary depending on the type of seizure you have. Examples of symptoms during a seizure include:  Shaking (convulsions).  Stiffness in the body.  Passing out (losing consciousness).  Head nodding.  Staring.  Not responding to sound or touch.  Loss of bladder control and bowel control. Some people have symptoms right before and right after a seizure happens. Symptoms before a seizure may include:  Fear.  Worry (anxiety).  Feeling like you may vomit (nauseous).  Feeling like the room is spinning (vertigo).  Feeling like you saw or heard something before (dj vu).  Odd  tastes or smells.  Changes in how you see. You may see flashing lights or spots. Symptoms after a seizure happens can include:  Confusion.  Sleepiness.  Headache.  Weakness on one side of the body. How is this treated? Most seizures will stop on their own in under 5 minutes. In these cases, no treatment is needed. Seizures that last longer than 5 minutes will usually need treatment. Treatment can include:  Medicines given through an IV tube.  Avoiding things that are known to cause your seizures. These can include medicines that you take for another condition.  Medicines to treat epilepsy.  Surgery to stop the seizures. This may be needed if medicines do not help. Follow these instructions at home: Medicines  Take over-the-counter and prescription medicines only as told by your doctor.  Do not eat or drink anything that may keep your medicine from working, such as alcohol. Activity  Do not do any activities that would be dangerous if you had another seizure, like driving or swimming. Wait until your doctor says it is safe for you to do them.  If you live in the [place] ask your local DMV (department of motor vehicles) when you can drive.  Get plenty of rest. Teaching others Teach friends and family what to do when you have a seizure. They should:  Lay you on the ground.  Protect your head and body.  Loosen any tight clothing around your neck.  Turn you on your side.  Not hold you down.  Not put anything into your mouth.  Know whether or not you need emergency care.  Stay with you until you are better.  General instructions  Contact your doctor each time you have a seizure.  Avoid anything that gives you seizures.  Keep a seizure diary. Write down: ? What you think caused each seizure. ? What you remember about each seizure.  Keep all follow-up visits as told by your doctor. This is important. Contact a doctor if:  You have another seizure.  You have  seizures more often.  There is any change in what happens during your seizures.  You keep having seizures with treatment.  You have symptoms of being sick or having an infection. Get help right away if:  You have a seizure that: ? Lasts longer than 5 minutes. ? Is different than seizures you had before. ? Makes it harder to breathe. ? Happens after you hurt your head.  You  have any of these symptoms after a seizure: ? Not being able to speak. ? Not being able to use a part of your body. ? Confusion. ? A bad headache.  You have two or more seizures in a row.  You do not wake up right after a seizure.  You get hurt during a seizure. These symptoms may be an emergency. Do not wait to see if the symptoms will go away. Get medical help right away. Call your local emergency services (911 in the [place] Do not drive yourself to the hospital. Summary  Seizures usually last from 30 seconds to 2 minutes. Usually, they are not harmful unless they last a long time.  Do not eat or drink anything that may keep your medicine from working, such as alcohol.  Teach friends and family what to do when you have a seizure.  Contact your doctor each time you have a seizure. This information is not intended to replace advice given to you by your health care provider. Make sure you discuss any questions you have with your health care provider. Document Revised: 09/14/2018 Document Reviewed: 09/14/2018 Elsevier Patient Education  [place].
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
## 2020-05-12 LAB — COMPREHENSIVE METABOLIC PANEL
ALT: 16 IU/L (ref 0–32)
AST: 26 IU/L (ref 0–40)
Albumin/Globulin Ratio: 1.8 (ref 1.2–2.2)
Albumin: 4.6 g/dL (ref 3.8–4.9)
Alkaline Phosphatase: 66 IU/L (ref 44–121)
BUN/Creatinine Ratio: 12 (ref 9–23)
BUN: 12 mg/dL (ref 6–24)
Bilirubin Total: 0.3 mg/dL (ref 0.0–1.2)
CO2: 25 mmol/L (ref 20–29)
Calcium: 9.8 mg/dL (ref 8.7–10.2)
Chloride: 103 mmol/L (ref 96–106)
Creatinine, Ser: 0.97 mg/dL (ref 0.57–1.00)
GFR calc Af Amer: 74 mL/min/{1.73_m2} (ref >59)
GFR calc non Af Amer: 64 mL/min/{1.73_m2} (ref >59)
Globulin, Total: 2.5 g/dL (ref 1.5–4.5)
Glucose: 92 mg/dL (ref 65–99)
Potassium: 4.6 mmol/L (ref 3.5–5.2)
Sodium: 140 mmol/L (ref 134–144)
Total Protein: 7.1 g/dL (ref 6.0–8.5)

## 2020-05-12 LAB — LAMOTRIGINE LEVEL: Lamotrigine Lvl: 9.6 ug/mL (ref 2.0–20.0)

## 2020-05-13 ENCOUNTER — Telehealth: Payer: Self-pay

## 2020-05-13 NOTE — Telephone Encounter (Signed)
Jane Heath is a 59 y.o. female was contacted and notified of the message below. Pt has no additional questions or concerns.

## 2020-05-13 NOTE — Telephone Encounter (Signed)
-----   Message from Debbora Presto, NP sent at 05/13/2020  7:16 AM EDT ----- Labs normal

## 2020-05-25 ENCOUNTER — Encounter: Payer: Self-pay | Admitting: Emergency Medicine

## 2020-05-25 ENCOUNTER — Other Ambulatory Visit: Payer: Self-pay

## 2020-05-25 ENCOUNTER — Ambulatory Visit: Payer: BC Managed Care – PPO | Admitting: Emergency Medicine

## 2020-05-25 VITALS — BP 131/78 | HR 77 | Temp 97.5°F | Resp 16 | Ht 62.0 in | Wt 180.8 lb

## 2020-05-25 DIAGNOSIS — G40909 Epilepsy, unspecified, not intractable, without status epilepticus: Secondary | ICD-10-CM

## 2020-05-25 DIAGNOSIS — Z6833 Body mass index (BMI) 33.0-33.9, adult: Secondary | ICD-10-CM | POA: Diagnosis not present

## 2020-05-25 DIAGNOSIS — Z23 Encounter for immunization: Secondary | ICD-10-CM

## 2020-05-25 NOTE — Progress Notes (Signed)
Jane Heath 59 y.o.   Chief Complaint  Patient presents with  . Seizures    disorder 3 month follow up    HISTORY OF PRESENT ILLNESS: This is a 59 y.o. female with history of seizure disorder.  Presently on Lamictal 100 mg but had to drop it to once a day due to itching.  As a result she had seizure at home on 05/16/2020.  Presently seeing [facility]. States "I am not doing well".  States she is forgetful with diminished concentration ability. Not working at present time.  Inquiring about Social Security disability. No other complaints or medical concerns at this time.  HPI   Prior to Admission medications   Medication Sig Start Date End Date Taking? Authorizing Provider  lamoTRIgine (LAMICTAL) 100 MG tablet Take 100 mg twice daily 02/10/20  Yes Penumalli, Vikram R, MD  metFORMIN (GLUCOPHAGE) 500 MG tablet Take 500 mg by mouth daily.  07/21/19  [provider]    No Known Allergies  Patient Active Problem List   Diagnosis Date Noted  . Seizure disorder ([place]) 01/02/2020  . Flu-like symptoms 06/06/2017  . Influenza due to identified novel influenza A virus with other respiratory manifestations 06/06/2017  . Laryngitis 06/06/2017    Past Medical History:  Diagnosis Date  . Bronchitis   . COVID-19 virus infection 07/2019  . OSA (obstructive sleep apnea)   . Seizure ([place]) 06/2019   most recent 12/19/19    Past Surgical History:  Procedure Laterality Date  . ABDOMINAL HYSTERECTOMY     1987  . BREAST REDUCTION SURGERY  2004  . REDUCTION MAMMAPLASTY Bilateral   . tummy tuck  2008    Social History   Socioeconomic History  . Marital status: Single    Spouse name: Not on file  . Number of children: 2  . Years of education: Not on file  . Highest education level: Not on file  Occupational History    Comment: third shift  Tobacco Use  . Smoking status: Never Smoker  . Smokeless tobacco: Never Used  Substance and Sexual Activity  .  Alcohol use: Yes    Comment: 2 x week  . Drug use: Never  . Sexual activity: Not on file  Other Topics Concern  . Not on file  Social History Narrative   Lives with mother.  Works for [employer].  Education college 2 yrs.  Divorced.  No children.  Caffeine 12-16 oz daily.   Social Determinants of Health   Financial Resource Strain:   . Difficulty of Paying Living Expenses: Not on file  Food Insecurity:   . Worried About Charity fundraiser in the Last Year: Not on file  . Ran Out of Food in the Last Year: Not on file  Transportation Needs:   . Lack of Transportation (Medical): Not on file  . Lack of Transportation (Non-Medical): Not on file  Physical Activity:   . Days of Exercise per Week: Not on file  . Minutes of Exercise per Session: Not on file  Stress:   . Feeling of Stress : Not on file  Social Connections:   . Frequency of Communication with Friends and Family: Not on file  . Frequency of Social Gatherings with Friends and Family: Not on file  . Attends Religious Services: Not on file  . Active Member of Clubs or Organizations: Not on file  . Attends Archivist Meetings: Not on file  . Marital Status: Not on file  Intimate Partner Violence:   . Fear of Current or Ex-Partner: Not on file  . Emotionally Abused: Not on file  . Physically Abused: Not on file  . Sexually Abused: Not on file    Family History  Problem Relation Age of Onset  . Hypertension Mother   . Heart disease Mother   . Hypertension Sister   . Diabetes Brother   . Hypertension Brother   . Heart disease Maternal Grandmother   . Alzheimer's disease Other      Review of Systems  Constitutional: Negative.  Negative for fever.  HENT: Negative.  Negative for congestion and sore throat.   Respiratory: Negative.  Negative for cough and shortness of breath.   Cardiovascular: Negative.  Negative for chest pain and palpitations.  Gastrointestinal: Negative for abdominal pain, diarrhea, nausea  and vomiting.  Genitourinary: Negative.  Negative for dysuria and hematuria.  Skin: Negative.  Negative for rash.  Neurological: Negative.  Negative for dizziness and headaches.  All other systems reviewed and are negative.     Today's Vitals   05/25/20 1041  BP: 131/78  Pulse: 77  Resp: 16  Temp: (!) 97.5 F (36.4 C)  TempSrc: Temporal  SpO2: 98%  Weight: 180 lb 12.8 oz (82 kg)  Height: 5\' 2"  (1.575 m)   Body mass index is 33.07 kg/m. Wt Readings from Last 3 Encounters:  05/25/20 180 lb 12.8 oz (82 kg)  05/11/20 183 lb (83 kg)  02/18/20 189 lb 6.4 oz (85.9 kg)    Physical Exam Vitals reviewed.  Constitutional:      Appearance: Normal appearance.  HENT:     Head: Normocephalic.  Eyes:     Extraocular Movements: Extraocular movements intact.     Conjunctiva/sclera: Conjunctivae normal.     Pupils: Pupils are equal, round, and reactive to light.  Cardiovascular:     Rate and Rhythm: Normal rate and regular rhythm.     Pulses: Normal pulses.     Heart sounds: Normal heart sounds.  Pulmonary:     Effort: Pulmonary effort is normal.     Breath sounds: Normal breath sounds.  Musculoskeletal:        General: Normal range of motion.     Cervical back: Normal range of motion and neck supple.  Skin:    General: Skin is warm and dry.     Capillary Refill: Capillary refill takes less than 2 seconds.  Neurological:     General: No focal deficit present.     Mental Status: She is alert and oriented to person, place, and time.  Psychiatric:        Mood and Affect: Mood normal.        Behavior: Behavior normal.      ASSESSMENT & PLAN: Jane Heath was seen today for seizures.  Diagnoses and all orders for this visit:  Seizure disorder ([place])  Body mass index (BMI) of 33.0-33.9 in adult  Other orders -     Flu Vaccine QUAD 36+ mos IM    Patient Instructions       If you have lab work done today you will be contacted with your lab results within the next 2  weeks.  If you have not heard from [place] then please contact us. The fastest way to get your results is to register for My Chart.   IF you received an x-ray today, you will receive an invoice from [facility]. Please contact [facility] at [phone] with questions or concerns regarding your  invoice.   IF you received labwork today, you will receive an invoice from [place]. Please contact [facility] at [phone] with questions or concerns regarding your invoice.   Our billing staff will not be able to assist you with questions regarding bills from these companies.  You will be contacted with the lab results as soon as they are available. The fastest way to get your results is to activate your My Chart account. Instructions are located on the last page of this paperwork. If you have not heard from [place] regarding the results in 2 weeks, please contact this office.     Seizure, Adult A seizure is a sudden burst of abnormal electrical activity in the brain. Seizures usually last from 30 seconds to 2 minutes. They can cause many different symptoms. Usually, seizures are not harmful unless they last a long time. What are the causes? Common causes of this condition include:  Fever or infection.  Conditions that affect the brain, such as: ? A brain abnormality that you were born with. ? A brain or head injury. ? Bleeding in the brain. ? A tumor. ? Stroke. ? Brain disorders such as autism or cerebral palsy.  Low blood sugar.  Conditions that are passed from parent to child (are inherited).  Problems with substances, such as: ? Having a reaction to a drug or a medicine. ? Suddenly stopping the use of a substance (withdrawal). In some cases, the cause may not be known. A person who has repeated seizures over time without a clear cause has a condition called epilepsy. What increases the risk? You are more likely to get this condition if you have:  A family history of  epilepsy.  Had a seizure in the past.  A brain disorder.  A history of head injury, lack of oxygen at birth, or strokes. What are the signs or symptoms? There are many types of seizures. The symptoms vary depending on the type of seizure you have. Examples of symptoms during a seizure include:  Shaking (convulsions).  Stiffness in the body.  Passing out (losing consciousness).  Head nodding.  Staring.  Not responding to sound or touch.  Loss of bladder control and bowel control. Some people have symptoms right before and right after a seizure happens. Symptoms before a seizure may include:  Fear.  Worry (anxiety).  Feeling like you may vomit (nauseous).  Feeling like the room is spinning (vertigo).  Feeling like you saw or heard something before (dj vu).  Odd tastes or smells.  Changes in how you see. You may see flashing lights or spots. Symptoms after a seizure happens can include:  Confusion.  Sleepiness.  Headache.  Weakness on one side of the body. How is this treated? Most seizures will stop on their own in under 5 minutes. In these cases, no treatment is needed. Seizures that last longer than 5 minutes will usually need treatment. Treatment can include:  Medicines given through an IV tube.  Avoiding things that are known to cause your seizures. These can include medicines that you take for another condition.  Medicines to treat epilepsy.  Surgery to stop the seizures. This may be needed if medicines do not help. Follow these instructions at home: Medicines  Take over-the-counter and prescription medicines only as told by your doctor.  Do not eat or drink anything that may keep your medicine from working, such as alcohol. Activity  Do not do any activities that would be dangerous if you had another seizure,  like driving or swimming. Wait until your doctor says it is safe for you to do them.  If you live in the [place] ask your local DMV  (department of motor vehicles) when you can drive.  Get plenty of rest. Teaching others Teach friends and family what to do when you have a seizure. They should:  Lay you on the ground.  Protect your head and body.  Loosen any tight clothing around your neck.  Turn you on your side.  Not hold you down.  Not put anything into your mouth.  Know whether or not you need emergency care.  Stay with you until you are better.  General instructions  Contact your doctor each time you have a seizure.  Avoid anything that gives you seizures.  Keep a seizure diary. Write down: ? What you think caused each seizure. ? What you remember about each seizure.  Keep all follow-up visits as told by your doctor. This is important. Contact a doctor if:  You have another seizure.  You have seizures more often.  There is any change in what happens during your seizures.  You keep having seizures with treatment.  You have symptoms of being sick or having an infection. Get help right away if:  You have a seizure that: ? Lasts longer than 5 minutes. ? Is different than seizures you had before. ? Makes it harder to breathe. ? Happens after you hurt your head.  You have any of these symptoms after a seizure: ? Not being able to speak. ? Not being able to use a part of your body. ? Confusion. ? A bad headache.  You have two or more seizures in a row.  You do not wake up right after a seizure.  You get hurt during a seizure. These symptoms may be an emergency. Do not wait to see if the symptoms will go away. Get medical help right away. Call your local emergency services (911 in the [place] Do not drive yourself to the hospital. Summary  Seizures usually last from 30 seconds to 2 minutes. Usually, they are not harmful unless they last a long time.  Do not eat or drink anything that may keep your medicine from working, such as alcohol.  Teach friends and family what to do when you  have a seizure.  Contact your doctor each time you have a seizure. This information is not intended to replace advice given to you by your health care provider. Make sure you discuss any questions you have with your health care provider. Document Revised: 09/14/2018 Document Reviewed: 09/14/2018 Elsevier Patient Education  2020 Elsevier Inc.      [name], MD [facility]

## 2020-05-25 NOTE — Patient Instructions (Addendum)
If you have lab work done today you will be contacted with your lab results within the next 2 weeks.  If you have not heard from [place] then please contact us. The fastest way to get your results is to register for My Chart.   IF you received an x-ray today, you will receive an invoice from [facility]. Please contact [facility] at [phone] with questions or concerns regarding your invoice.   IF you received labwork today, you will receive an invoice from [place]. Please contact [facility] at [phone] with questions or concerns regarding your invoice.   Our billing staff will not be able to assist you with questions regarding bills from these companies.  You will be contacted with the lab results as soon as they are available. The fastest way to get your results is to activate your My Chart account. Instructions are located on the last page of this paperwork. If you have not heard from [place] regarding the results in 2 weeks, please contact this office.     Seizure, Adult A seizure is a sudden burst of abnormal electrical activity in the brain. Seizures usually last from 30 seconds to 2 minutes. They can cause many different symptoms. Usually, seizures are not harmful unless they last a long time. What are the causes? Common causes of this condition include:  Fever or infection.  Conditions that affect the brain, such as: ? A brain abnormality that you were born with. ? A brain or head injury. ? Bleeding in the brain. ? A tumor. ? Stroke. ? Brain disorders such as autism or cerebral palsy.  Low blood sugar.  Conditions that are passed from parent to child (are inherited).  Problems with substances, such as: ? Having a reaction to a drug or a medicine. ? Suddenly stopping the use of a substance (withdrawal). In some cases, the cause may not be known. A person who has repeated seizures over time without a clear cause has a condition called epilepsy. What  increases the risk? You are more likely to get this condition if you have:  A family history of epilepsy.  Had a seizure in the past.  A brain disorder.  A history of head injury, lack of oxygen at birth, or strokes. What are the signs or symptoms? There are many types of seizures. The symptoms vary depending on the type of seizure you have. Examples of symptoms during a seizure include:  Shaking (convulsions).  Stiffness in the body.  Passing out (losing consciousness).  Head nodding.  Staring.  Not responding to sound or touch.  Loss of bladder control and bowel control. Some people have symptoms right before and right after a seizure happens. Symptoms before a seizure may include:  Fear.  Worry (anxiety).  Feeling like you may vomit (nauseous).  Feeling like the room is spinning (vertigo).  Feeling like you saw or heard something before (dj vu).  Odd tastes or smells.  Changes in how you see. You may see flashing lights or spots. Symptoms after a seizure happens can include:  Confusion.  Sleepiness.  Headache.  Weakness on one side of the body. How is this treated? Most seizures will stop on their own in under 5 minutes. In these cases, no treatment is needed. Seizures that last longer than 5 minutes will usually need treatment. Treatment can include:  Medicines given through an IV tube.  Avoiding things that are known to cause your seizures. These can include medicines that  you take for another condition.  Medicines to treat epilepsy.  Surgery to stop the seizures. This may be needed if medicines do not help. Follow these instructions at home: Medicines  Take over-the-counter and prescription medicines only as told by your doctor.  Do not eat or drink anything that may keep your medicine from working, such as alcohol. Activity  Do not do any activities that would be dangerous if you had another seizure, like driving or swimming. Wait until  your doctor says it is safe for you to do them.  If you live in the [place] ask your local DMV (department of motor vehicles) when you can drive.  Get plenty of rest. Teaching others Teach friends and family what to do when you have a seizure. They should:  Lay you on the ground.  Protect your head and body.  Loosen any tight clothing around your neck.  Turn you on your side.  Not hold you down.  Not put anything into your mouth.  Know whether or not you need emergency care.  Stay with you until you are better.  General instructions  Contact your doctor each time you have a seizure.  Avoid anything that gives you seizures.  Keep a seizure diary. Write down: ? What you think caused each seizure. ? What you remember about each seizure.  Keep all follow-up visits as told by your doctor. This is important. Contact a doctor if:  You have another seizure.  You have seizures more often.  There is any change in what happens during your seizures.  You keep having seizures with treatment.  You have symptoms of being sick or having an infection. Get help right away if:  You have a seizure that: ? Lasts longer than 5 minutes. ? Is different than seizures you had before. ? Makes it harder to breathe. ? Happens after you hurt your head.  You have any of these symptoms after a seizure: ? Not being able to speak. ? Not being able to use a part of your body. ? Confusion. ? A bad headache.  You have two or more seizures in a row.  You do not wake up right after a seizure.  You get hurt during a seizure. These symptoms may be an emergency. Do not wait to see if the symptoms will go away. Get medical help right away. Call your local emergency services (911 in the [place] Do not drive yourself to the hospital. Summary  Seizures usually last from 30 seconds to 2 minutes. Usually, they are not harmful unless they last a long time.  Do not eat or drink anything that may  keep your medicine from working, such as alcohol.  Teach friends and family what to do when you have a seizure.  Contact your doctor each time you have a seizure. This information is not intended to replace advice given to you by your health care provider. Make sure you discuss any questions you have with your health care provider. Document Revised: 09/14/2018 Document Reviewed: 09/14/2018 Elsevier Patient Education  [place].

## 2020-06-02 NOTE — Progress Notes (Signed)
I reviewed note and agree with plan.   Penni Bombard, MD 06/84/0335, 3:31 PM Certified in Neurology, Neurophysiology and Neuroimaging  [facility] [address] [place], [place] [address] [phone]

## 2020-07-14 ENCOUNTER — Ambulatory Visit: Payer: Self-pay | Admitting: Family Medicine

## 2020-08-11 ENCOUNTER — Encounter: Payer: Self-pay | Admitting: Emergency Medicine

## 2020-08-11 ENCOUNTER — Ambulatory Visit: Payer: BC Managed Care – PPO | Admitting: Emergency Medicine

## 2020-08-11 ENCOUNTER — Other Ambulatory Visit: Payer: Self-pay

## 2020-08-11 VITALS — BP 130/70 | HR 87 | Temp 98.2°F | Resp 16 | Ht 62.0 in | Wt 178.0 lb

## 2020-08-11 DIAGNOSIS — Z1211 Encounter for screening for malignant neoplasm of colon: Secondary | ICD-10-CM | POA: Diagnosis not present

## 2020-08-11 DIAGNOSIS — G40909 Epilepsy, unspecified, not intractable, without status epilepticus: Secondary | ICD-10-CM | POA: Diagnosis not present

## 2020-08-11 DIAGNOSIS — R7303 Prediabetes: Secondary | ICD-10-CM

## 2020-08-11 DIAGNOSIS — Z8669 Personal history of other diseases of the nervous system and sense organs: Secondary | ICD-10-CM

## 2020-08-11 NOTE — Patient Instructions (Signed)
Seizure, Adult A seizure is a sudden burst of abnormal electrical and chemical activity in the brain. Seizures usually last from 30 seconds to 2 minutes.  What are the causes? Common causes of this condition include:  Fever or infection.  Problems that affect the brain. These may include: ? A brain or head injury. ? Bleeding in the brain. ? A brain tumor.  Low levels of blood sugar or salt.  Kidney problems or liver problems.  Conditions that are passed from parent to child (are inherited).  Problems with a substance, such as: ? Having a reaction to a drug or a medicine. ? Stopping the use of a substance all of a sudden (withdrawal).  A stroke.  Disorders that affect how you develop. Sometimes, the cause may not be known.  What increases the risk?  Having someone in your family who has epilepsy. In this condition, seizures happen again and again over time. They have no clear cause.  Having had a tonic-clonic seizure before. This type of seizure causes you to: ? Tighten the muscles of the whole body. ? Lose consciousness.  Having had a head injury or strokes before.  Having had a lack of oxygen at birth. What are the signs or symptoms? There are many types of seizures. The symptoms vary depending on the type of seizure you have. Symptoms during a seizure  Shaking that you cannot control (convulsions) with fast, jerky movements of muscles.  Stiffness of the body.  Breathing problems.  Feeling mixed up (confused).  Staring or not responding to sound or touch.  Head nodding.  Eyes that blink, flutter, or move fast.  Drooling, grunting, or making clicking sounds with your mouth  Losing control of when you pee or poop. Symptoms before a seizure  Feeling afraid, nervous, or worried.  Feeling like you may vomit.  Feeling like: ? You are moving when you are not. ? Things around you are moving when they are not.  Feeling like you saw or heard something before  (dj vu).  Odd tastes or smells.  Changes in how you see. You may see flashing lights or spots. Symptoms after a seizure  Feeling confused.  Feeling sleepy.  Headache.  Sore muscles. How is this treated? If your seizure stops on its own, you will not need treatment. If your seizure lasts longer than 5 minutes, you will normally need treatment. Treatment may include:  Medicines given through an IV tube.  Avoiding things, such as medicines, that are known to cause your seizures.  Medicines to prevent seizures.  A device to prevent or control seizures.  Surgery.  A diet low in carbohydrates and high in fat (ketogenic diet). Follow these instructions at home: Medicines  Take over-the-counter and prescription medicines only as told by your doctor.  Avoid foods or drinks that may keep your medicine from working, such as alcohol. Activity  Follow instructions about driving, swimming, or doing things that would be dangerous if you had another seizure. Wait until your doctor says it is safe for you to do these things.  If you live in the [place] ask your local department of motor vehicles when you can drive.  Get a lot of rest. Teaching others  Teach friends and family what to do when you have a seizure. They should: ? Help you get down to the ground. ? Protect your head and body. ? Loosen any clothing around your neck. ? Turn you on your side. ? Know whether or not   you need emergency care. ? Stay with you until you are better.  Also, tell them what not to do if you have a seizure. Tell them: ? They should not hold you down. ? They should not put anything in your mouth.   General instructions  Avoid anything that gives you seizures.  Keep a seizure diary. Write down: ? What you remember about each seizure. ? What you think caused each seizure.  Keep all follow-up visits. Contact a doctor if:  You have another seizure or seizures. Call the doctor each time you  have a seizure.  The pattern of your seizures changes.  You keep having seizures with treatment.  You have symptoms of being sick or having an infection.  You are not able to take your medicine. Get help right away if:  You have any of these problems: ? A seizure that lasts longer than 5 minutes. ? Many seizures in a row and you do not feel better between seizures. ? A seizure that makes it harder to breathe. ? A seizure and you can no longer speak or use part of your body.  You do not wake up right after a seizure.  You get hurt during a seizure.  You feel confused or have pain right after a seizure. These symptoms may be an emergency. Get help right away. Call your local emergency services (911 in the [place]  Do not wait to see if the symptoms will go away.  Do not drive yourself to the hospital. Summary  A seizure is a sudden burst of abnormal electrical and chemical activity in the brain. Seizures normally last from 30 seconds to 2 minutes.  Causes of seizures include illness, injury to the head, low levels of blood sugar or salt, and certain conditions.  Most seizures will stop on their own in less than 5 minutes. Seizures that last longer than 5 minutes are a medical emergency and need treatment right away.  Many medicines are used to treat seizures. Take over-the-counter and prescription medicines only as told by your doctor. This information is not intended to replace advice given to you by your health care provider. Make sure you discuss any questions you have with your health care provider. Document Revised: 01/03/2020 Document Reviewed: 01/03/2020 Elsevier Patient Education  2021 Elsevier Inc.  

## 2020-08-11 NOTE — Progress Notes (Signed)
Jane Heath 60 y.o.   Chief Complaint  Patient presents with  . Headache    Per patient for one week with dizziness  . Seizures    Discuss the seizure    HISTORY OF PRESENT ILLNESS: This is a 60 y.o. female with history of seizure disorder and also recently diagnosed with obstructive sleep apnea. Here for follow-up.  Presently taking Lamictal 100 mg twice a day. Complaining of morning headaches for the past 2 weeks. Also states she switched jobs.  Previous loud machinery at work was triggering seizures.  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  Has not been able to get CPAP machine yet due to [employer]. Fully vaccinated against Covid with a booster.  Also has history of Covid infection. No other complaints or medical concerns today.  HPI   Prior to Admission medications   Medication Sig Start Date End Date Taking? Authorizing Provider  lamoTRIgine (LAMICTAL) 100 MG tablet Take 100 mg twice daily 02/10/20  Yes Penumalli, Vikram R, MD  metFORMIN (GLUCOPHAGE) 500 MG tablet Take 500 mg by mouth daily.  07/21/19  [provider]    No Known Allergies  Patient Active Problem List   Diagnosis Date Noted  . Seizure disorder ([place]) 01/02/2020  . Flu-like symptoms 06/06/2017  . Influenza due to identified novel influenza A virus with other respiratory manifestations 06/06/2017  . Laryngitis 06/06/2017    Past Medical History:  Diagnosis Date  . Bronchitis   . COVID-19 virus infection 07/2019  . OSA (obstructive sleep apnea)   . Seizure ([place]) 06/2019   most recent 12/19/19    Past Surgical History:  Procedure Laterality Date  . ABDOMINAL HYSTERECTOMY     1987  . BREAST REDUCTION SURGERY  2004  . REDUCTION MAMMAPLASTY Bilateral   . tummy tuck  2008    Social History   Socioeconomic History  . Marital status: Single    Spouse name: Not on file  . Number of children: 2  . Years of education: Not on file  . Highest education level: Not  on file  Occupational History    Comment: third shift  Tobacco Use  . Smoking status: Never Smoker  . Smokeless tobacco: Never Used  Substance and Sexual Activity  . Alcohol use: Yes    Comment: 2 x week  . Drug use: Never  . Sexual activity: Not on file  Other Topics Concern  . Not on file  Social History Narrative   Lives with mother.  Works for [employer].  Education college 2 yrs.  Divorced.  No children.  Caffeine 12-16 oz daily.   Social Determinants of Health   Financial Resource Strain: Not on file  Food Insecurity: Not on file  Transportation Needs: Not on file  Physical Activity: Not on file  Stress: Not on file  Social Connections: Not on file  Intimate Partner Violence: Not on file    Family History  Problem Relation Age of Onset  . Hypertension Mother   . Heart disease Mother   . Hypertension Sister   . Diabetes Brother   . Hypertension Brother   . Heart disease Maternal Grandmother   . Alzheimer's disease Other      Review of Systems  Constitutional: Negative.  Negative for chills and fever.  HENT: Negative.  Negative for congestion and sore throat.   Respiratory: Negative.  Negative for cough and shortness of breath.   Cardiovascular: Negative.  Negative for chest  pain and palpitations.  Gastrointestinal: Negative.  Negative for abdominal pain, diarrhea, nausea and vomiting.  Genitourinary: Negative.  Negative for dysuria and hematuria.  Skin: Negative.  Negative for rash.  Neurological: Positive for headaches.  All other systems reviewed and are negative.   Today's Vitals   08/11/20 0800  BP: (!) 145/86  Pulse: 87  Resp: 16  Temp: 98.2 F (36.8 C)  TempSrc: Temporal  SpO2: 97%  Weight: 178 lb (80.7 kg)  Height: 5\' 2"  (1.575 m)   Body mass index is 32.56 kg/m. Wt Readings from Last 3 Encounters:  08/11/20 178 lb (80.7 kg)  05/25/20 180 lb 12.8 oz (82 kg)  05/11/20 183 lb (83 kg)    Physical Exam Vitals reviewed.  Constitutional:       Appearance: She is well-developed.  HENT:     Head: Normocephalic.  Eyes:     Extraocular Movements: Extraocular movements intact.     Conjunctiva/sclera: Conjunctivae normal.     Pupils: Pupils are equal, round, and reactive to light.  Cardiovascular:     Rate and Rhythm: Normal rate and regular rhythm.     Pulses: Normal pulses.     Heart sounds: Normal heart sounds.  Pulmonary:     Effort: Pulmonary effort is normal.     Breath sounds: Normal breath sounds.  Musculoskeletal:        General: Normal range of motion.     Cervical back: Normal range of motion and neck supple.  Skin:    General: Skin is warm and dry.     Capillary Refill: Capillary refill takes less than 2 seconds.  Neurological:     General: No focal deficit present.     Mental Status: She is alert and oriented to person, place, and time.  Psychiatric:        Mood and Affect: Mood normal.        Behavior: Behavior normal.      ASSESSMENT & PLAN: Clinically stable.  Morning headaches most likely related to obstructive sleep apnea and lack of quality sleep. Stable seizure disorder on Lamictal twice a day.  Continue present medications no changes. Diet and nutrition discussed. No other medical concerns identified during this visit. Jane Heath was seen today for headache and seizures.  Diagnoses and all orders for this visit:  Seizure disorder ([place])  Prediabetes  History of sleep apnea  Screening for colon cancer -     Ambulatory referral to Gastroenterology    Patient Instructions   Seizure, Adult A seizure is a sudden burst of abnormal electrical and chemical activity in the brain. Seizures usually last from 30 seconds to 2 minutes.  What are the causes? Common causes of this condition include:  Fever or infection.  Problems that affect the brain. These may include: ? A brain or head injury. ? Bleeding in the brain. ? A brain tumor.  Low levels of blood sugar or salt.  Kidney problems or  liver problems.  Conditions that are passed from parent to child (are inherited).  Problems with a substance, such as: ? Having a reaction to a drug or a medicine. ? Stopping the use of a substance all of a sudden (withdrawal).  A stroke.  Disorders that affect how you develop. Sometimes, the cause may not be known.  What increases the risk?  Having someone in your family who has epilepsy. In this condition, seizures happen again and again over time. They have no clear cause.  Having had a tonic-clonic  seizure before. This type of seizure causes you to: ? Tighten the muscles of the whole body. ? Lose consciousness.  Having had a head injury or strokes before.  Having had a lack of oxygen at birth. What are the signs or symptoms? There are many types of seizures. The symptoms vary depending on the type of seizure you have. Symptoms during a seizure  Shaking that you cannot control (convulsions) with fast, jerky movements of muscles.  Stiffness of the body.  Breathing problems.  Feeling mixed up (confused).  Staring or not responding to sound or touch.  Head nodding.  Eyes that blink, flutter, or move fast.  Drooling, grunting, or making clicking sounds with your mouth  Losing control of when you pee or poop. Symptoms before a seizure  Feeling afraid, nervous, or worried.  Feeling like you may vomit.  Feeling like: ? You are moving when you are not. ? Things around you are moving when they are not.  Feeling like you saw or heard something before (dj vu).  Odd tastes or smells.  Changes in how you see. You may see flashing lights or spots. Symptoms after a seizure  Feeling confused.  Feeling sleepy.  Headache.  Sore muscles. How is this treated? If your seizure stops on its own, you will not need treatment. If your seizure lasts longer than 5 minutes, you will normally need treatment. Treatment may include:  Medicines given through an IV  tube.  Avoiding things, such as medicines, that are known to cause your seizures.  Medicines to prevent seizures.  A device to prevent or control seizures.  Surgery.  A diet low in carbohydrates and high in fat (ketogenic diet). Follow these instructions at home: Medicines  Take over-the-counter and prescription medicines only as told by your doctor.  Avoid foods or drinks that may keep your medicine from working, such as alcohol. Activity  Follow instructions about driving, swimming, or doing things that would be dangerous if you had another seizure. Wait until your doctor says it is safe for you to do these things.  If you live in the [place] ask your local department of motor vehicles when you can drive.  Get a lot of rest. Teaching others  Teach friends and family what to do when you have a seizure. They should: ? Help you get down to the ground. ? Protect your head and body. ? Loosen any clothing around your neck. ? Turn you on your side. ? Know whether or not you need emergency care. ? Stay with you until you are better.  Also, tell them what not to do if you have a seizure. Tell them: ? They should not hold you down. ? They should not put anything in your mouth.   General instructions  Avoid anything that gives you seizures.  Keep a seizure diary. Write down: ? What you remember about each seizure. ? What you think caused each seizure.  Keep all follow-up visits. Contact a doctor if:  You have another seizure or seizures. Call the doctor each time you have a seizure.  The pattern of your seizures changes.  You keep having seizures with treatment.  You have symptoms of being sick or having an infection.  You are not able to take your medicine. Get help right away if:  You have any of these problems: ? A seizure that lasts longer than 5 minutes. ? Many seizures in a row and you do not feel better between seizures. ?  A seizure that makes it harder to  breathe. ? A seizure and you can no longer speak or use part of your body.  You do not wake up right after a seizure.  You get hurt during a seizure.  You feel confused or have pain right after a seizure. These symptoms may be an emergency. Get help right away. Call your local emergency services (911 in the [place]  Do not wait to see if the symptoms will go away.  Do not drive yourself to the hospital. Summary  A seizure is a sudden burst of abnormal electrical and chemical activity in the brain. Seizures normally last from 30 seconds to 2 minutes.  Causes of seizures include illness, injury to the head, low levels of blood sugar or salt, and certain conditions.  Most seizures will stop on their own in less than 5 minutes. Seizures that last longer than 5 minutes are a medical emergency and need treatment right away.  Many medicines are used to treat seizures. Take over-the-counter and prescription medicines only as told by your doctor. This information is not intended to replace advice given to you by your health care provider. Make sure you discuss any questions you have with your health care provider. Document Revised: 01/03/2020 Document Reviewed: 01/03/2020 Elsevier Patient Education  2021 Elsevier Inc.      [name], MD [facility]

## 2020-08-18 ENCOUNTER — Ambulatory Visit: Payer: BC Managed Care – PPO | Admitting: Diagnostic Neuroimaging

## 2020-09-09 ENCOUNTER — Ambulatory Visit: Payer: BC Managed Care – PPO | Admitting: Family Medicine

## 2020-09-09 ENCOUNTER — Encounter: Payer: Self-pay | Admitting: Family Medicine

## 2020-10-29 ENCOUNTER — Encounter: Payer: Self-pay | Admitting: Psychology

## 2020-12-01 ENCOUNTER — Other Ambulatory Visit: Payer: Self-pay

## 2020-12-01 ENCOUNTER — Encounter: Payer: 59 | Attending: Psychology | Admitting: Psychology

## 2020-12-01 DIAGNOSIS — G4733 Obstructive sleep apnea (adult) (pediatric): Secondary | ICD-10-CM | POA: Diagnosis not present

## 2020-12-01 DIAGNOSIS — R413 Other amnesia: Secondary | ICD-10-CM

## 2020-12-01 DIAGNOSIS — G40909 Epilepsy, unspecified, not intractable, without status epilepticus: Secondary | ICD-10-CM | POA: Insufficient documentation

## 2020-12-01 DIAGNOSIS — G4726 Circadian rhythm sleep disorder, shift work type: Secondary | ICD-10-CM | POA: Diagnosis present
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
## 2020-12-02 ENCOUNTER — Encounter: Payer: Self-pay | Admitting: Psychology

## 2020-12-13 ENCOUNTER — Encounter: Payer: Self-pay | Admitting: Psychology

## 2021-01-27 ENCOUNTER — Telehealth: Payer: Self-pay

## 2021-01-27 NOTE — Telephone Encounter (Signed)
Pt notified that virtual appt was made with Dr Alain Marion at [facility].  Pt urged to go to UC if symptoms became worse or use the after hrs virtual option at [email or web address].  Pt verb understanding.

## 2021-01-27 NOTE — Telephone Encounter (Signed)
Pt has stated she has tested POS for COVID on Monday. Her sxs are Cough, chest congestion and sore throat.  *Pt states she had COVID last year and was Rx'd a cough medicine to help her with her cough and is in need of this medication again as the cough is causing her discomfort and inability to sleep at night.  **Pt asked if possible can the MA call her when medication is sent.

## 2021-01-28 ENCOUNTER — Encounter: Payer: Self-pay | Admitting: Internal Medicine

## 2021-01-28 ENCOUNTER — Telehealth (INDEPENDENT_AMBULATORY_CARE_PROVIDER_SITE_OTHER): Payer: 59 | Admitting: Internal Medicine

## 2021-01-28 DIAGNOSIS — J209 Acute bronchitis, unspecified: Secondary | ICD-10-CM | POA: Insufficient documentation

## 2021-01-28 DIAGNOSIS — U071 COVID-19: Secondary | ICD-10-CM | POA: Insufficient documentation

## 2021-01-28 DIAGNOSIS — G40909 Epilepsy, unspecified, not intractable, without status epilepticus: Secondary | ICD-10-CM

## 2021-01-28 MED ORDER — PROMETHAZINE-CODEINE 6.25-10 MG/5ML PO SYRP: 5.0000 mL | ORAL_SOLUTION | Freq: Four times a day (QID) | ORAL | 0 refills | Status: DC | PRN

## 2021-01-28 MED ORDER — AZITHROMYCIN 250 MG PO TABS: ORAL_TABLET | ORAL | 0 refills | Status: DC

## 2021-01-28 MED ORDER — METHYLPREDNISOLONE 4 MG PO TBPK: ORAL_TABLET | ORAL | 0 refills | Status: DC

## 2021-01-28 NOTE — Progress Notes (Signed)
Virtual Visit via Video Note  I connected with Jane Heath on 01/28/21 at  4:00 PM EDT by a video enabled telemedicine application and verified that I am speaking with the correct person using two identifiers.   I discussed the limitations of evaluation and management by telemedicine and the availability of in person appointments. The patient expressed understanding and agreed to proceed.  I was located at our [facility] office. The patient was at home. There was no one else present in the visit.   History of Present Illness: C/o cough, ST, chest pain from coughing , mild shortness of breath. No abdominal pain, diarrhea, constipation, seizures, skin rashes. Pt was not able to sleep due to cough. Pt tested COVID (+) on Monday.   Observations/Objective: The patient appears to be in no acute distress, coughing.   Assessment and Plan:  See my Assessment and Plan. Follow Up Instructions:    I discussed the assessment and treatment plan with the patient. The patient was provided an opportunity to ask questions and all were answered. The patient agreed with the plan and demonstrated an understanding of the instructions.   The patient was advised to call back or seek an in-person evaluation if the symptoms worsen or if the condition fails to improve as anticipated.  I provided face-to-face time during this encounter. We were at different locations.   Walker Kehr, MD

## 2021-01-28 NOTE — Assessment & Plan Note (Signed)
COVID sx's are better. Pt declined antiviral Rx. OK to go to work on Monday if ok. Wear mask x 5 days

## 2021-01-28 NOTE — Assessment & Plan Note (Addendum)
It is a [id number] complication: Z pack, Medrol pack, Prom-cod syrup prn

## 2021-01-28 NOTE — Assessment & Plan Note (Addendum)
Remote. On treatment. Risk of seizure on Codeine cough syrup is low. On Lamictal

## 2021-01-29 NOTE — Telephone Encounter (Signed)
   Pharmacy states they do not have order for promethazine-codeine (PHENERGAN WITH CODEINE) 6.25-10 MG/5ML syrup  Please re-send

## 2021-01-29 NOTE — Telephone Encounter (Signed)
Patient calling again to request medication be resent by covering provder

## 2021-02-02 ENCOUNTER — Other Ambulatory Visit: Payer: Self-pay | Admitting: Internal Medicine

## 2021-02-02 MED ORDER — PROMETHAZINE-CODEINE 6.25-10 MG/5ML PO SYRP: 5.0000 mL | ORAL_SOLUTION | Freq: Four times a day (QID) | ORAL | 0 refills | Status: DC | PRN

## 2021-02-02 NOTE — Telephone Encounter (Signed)
Person who answered the phone verified that pt received cough medication this am.  Denies further ques/concerns.

## 2021-02-02 NOTE — Telephone Encounter (Signed)
I did resend it today.  Thanks

## 2021-03-17 ENCOUNTER — Ambulatory Visit: Payer: 59 | Admitting: Emergency Medicine

## 2021-03-25 ENCOUNTER — Encounter: Payer: 59 | Attending: Psychology

## 2021-03-25 ENCOUNTER — Other Ambulatory Visit: Payer: Self-pay

## 2021-03-25 DIAGNOSIS — G40909 Epilepsy, unspecified, not intractable, without status epilepticus: Secondary | ICD-10-CM | POA: Insufficient documentation

## 2021-03-25 NOTE — Progress Notes (Signed)
Behavioral Observation  The patient appeared well-groomed and appropriately dressed. Her manners were polite and appropriate to the situation. The patient demonstrated a positive attitude toward testing and showed good effort.   Neuropsychology Note  Jane Heath completed 240 minutes of neuropsychological testing with technician, Dina Rich, BA, under the supervision of Ilean Skill, PsyD., Clinical Neuropsychologist. The patient did not appear overtly distressed by the testing session, per behavioral observation or via self-report to the technician. Rest breaks were offered.   Clinical Decision Making: In considering the patient's current level of functioning, level of presumed impairment, nature of symptoms, emotional and behavioral responses during clinical interview, level of literacy, and observed level of motivation/effort, a battery of tests was selected by Dr. Sima Matas during initial consultation on 12/01/2020. This was communicated to the technician. Communication between the neuropsychologist and technician was ongoing throughout the testing session and changes were made as deemed necessary based on patient performance on testing, technician observations and additional pertinent factors such as those listed above.  Tests Administered: Controlled Oral Word Association Test (COWAT; FAS & Animals)  Finger Tapping Test (FTT) Grooved Pegboard Hand Dynamometer  Wechsler Adult Intelligence Scale, 4th Edition (WAIS-IV) Wechsler Memory Scale, 4th Edition (WMS-IV); Adult Battery   [employer] (WCST)  Results:  [employer] Administered - 128 Trials Correct - 90 Total Errors - 38 % Errors - 30% Perseverative Responses - 10 % Perseverative Responses - 8% Perseverative Errors - 10 % Perseverative Errors - 8% Non-perseverative Errors - 28 %Non-perseverative Errors - 22% Conceptual Level Responses - 77 % Conceptual Level Responses -  60% Categories Completed - 3 Trials to Complete 1st Category - 23 Failure to Maintain Set - 4 Learning to Learn - -3.69   Hand Dynamometer Left 1. 19 2. 23 Right 1. 15 2. 17  Grooved Pegboard Left Time = 1:56.55 Drops: 1 Right  Time = 2:39.38 Drops: 6  Finger Tapping Test Left 1. 10 2. 31 3. 33 4. 29 5. 31 Right 1. 12 2. 20 3.28 4. 34 5.37  COWAT FAS  Total = 37 Z = -0.413 Animals  Total = 14 Z = -0.347   WAIS-IV  Composite Score Summary  Scale Sum of Scaled Scores Composite Score Percentile Rank 95% Conf. Interval Qualitative Description  Verbal Comprehension 18 VCI 78 7 73-85 Borderline  Perceptual Reasoning 17 PRI 75 5 70-82 Borderline  Working Memory 15 WMI 86 18 80-94 Low Average  Processing Speed 13 PSI 81 10 75-91 Low Average  Full Scale 63 FSIQ 75 5 71-80 Borderline  General Ability 35 GAI 74 4 70-80 Borderline      Verbal Comprehension Subtests Summary  Subtest Raw Score Scaled Score Percentile Rank Reference Group Scaled Score SEM  Similarities '17 6 9 6 '$ 1.08  Vocabulary '23 6 9 7 '$ 0.73  Information '7 6 9 6 '$ 0.67  (Comprehension) '15 6 9 6 '$ 1.08       Perceptual Reasoning Subtests Summary  Subtest Raw Score Scaled Score Percentile Rank Reference Group Scaled Score SEM  Block Design '12 4 2 3 '$ 1.04  Matrix Reasoning '8 6 9 4 '$ 0.95  Visual Puzzles '8 7 16 6 '$ 0.99  (Figure Weights) '5 5 5 4 '$ 0.99  (Picture Completion) '4 4 2 3 '$ 1.12       Working Doctor, general practice Raw Score Scaled Score Percentile Rank Reference Group Scaled Score SEM  Digit Span 27 10 50 9 0.85  Arithmetic 8  $'5 5 5 'Z$ 1.04  (Letter-Number Seq.) '16 8 25 7 '$ 1.08       Processing Speed Subtests Summary  Subtest Raw Score Scaled Score Percentile Rank Reference Group Scaled Score SEM  Symbol Search '18 6 9 5 '$ 1.31  Coding 43 '7 16 5 '$ 0.99  (Cancellation) '16 3 1 2 '$ 1.34      WMS-IV  Index Score Summary  Index Sum of Scaled Scores Index Score  Percentile Rank 95% Confidence Interval Qualitative Descriptor  Auditory Memory (AMI) 33 90 25 84-97 Average  Visual Memory (VMI) 29 84 14 79-90 Low Average  Visual Working Memory (VWMI) 14 83 13 77-91 Low Average  Immediate Memory (IMI) 32 86 18 80-93 Low Average  Delayed Memory (DMI) 30 82 12 76-90 Low Average      Primary Subtest Scaled Score Summary  Subtest Domain Raw Score Scaled Score Percentile Rank  Logical Memory I AM '18 7 16  '$ Logical Memory II AM '11 6 9  '$ Verbal [employer] I AM 24 9 37  Verbal [employer] II AM 10 11 63  Designs I VM 54 8 25  Designs II VM 48 9 37  Visual Reproduction I VM '29 8 25  '$ Visual Reproduction II VM '5 4 2  '$ Spatial Addition VWM '6 6 9  '$ Symbol Span VWM '17 8 25      '$ Auditory Memory Process Score Summary  Process Score Raw Score Scaled Score Percentile Rank Cumulative Percentage (Base Rate)  LM II Recognition 18 - - 3-9%  VPA II Recognition 38 - - 26-50%       Visual Memory Process Score Summary  Process Score Raw Score Scaled Score Percentile Rank Cumulative Percentage (Base Rate)  DE I Content 33 9 37 -  DE I Spatial '11 6 9 '$ -  DE II Content '27 7 16 '$ -  DE II Spatial 13 11 63 -  DE II Recognition 17 - - >75%  VR II Recognition 3 - - 10-16%      ABILITY-MEMORY ANALYSIS  Ability Score:  VCI: 78 Date of Testing:  WAIS-IV; WMS-IV 2021/03/25  Predicted Difference Method   Index Predicted WMS-IV Index Score Actual WMS-IV Index Score Difference Critical Value  Significant Difference Y/N Base Rate  Auditory Memory 89 90 -1 9.00 N   Visual Memory 90 84 6 8.38 N   Visual Working Memory 88 83 5 10.86 N   Immediate Memory 87 86 1 10.12 N   Delayed Memory 89 82 7 9.95 N   Statistical significance (critical value) at the .01 level.        Feedback to Patient: Jane Heath will return on 06/08/2021 for an interactive feedback session with Dr. Sima Matas at which time her test performances, clinical  impressions and treatment recommendations will be reviewed in detail. The patient understands she can contact our office should she require our assistance before this time.  240 minutes spent face-to-face with patient administering standardized tests, 30 minutes spent scoring Environmental education officer). [CPT [id number], [id number]  Full report to follow.

## 2021-04-07 ENCOUNTER — Encounter: Payer: Self-pay | Admitting: Emergency Medicine

## 2021-04-07 ENCOUNTER — Ambulatory Visit (INDEPENDENT_AMBULATORY_CARE_PROVIDER_SITE_OTHER): Payer: 59 | Admitting: Emergency Medicine

## 2021-04-07 ENCOUNTER — Other Ambulatory Visit: Payer: Self-pay

## 2021-04-07 VITALS — BP 132/86 | HR 70 | Temp 97.8°F | Ht 62.0 in | Wt 168.0 lb

## 2021-04-07 DIAGNOSIS — Z23 Encounter for immunization: Secondary | ICD-10-CM

## 2021-04-07 DIAGNOSIS — G40909 Epilepsy, unspecified, not intractable, without status epilepticus: Secondary | ICD-10-CM | POA: Diagnosis not present

## 2021-04-07 DIAGNOSIS — Z1211 Encounter for screening for malignant neoplasm of colon: Secondary | ICD-10-CM

## 2021-04-07 DIAGNOSIS — S83249A Other tear of medial meniscus, current injury, unspecified knee, initial encounter: Secondary | ICD-10-CM | POA: Insufficient documentation

## 2021-04-07 DIAGNOSIS — L309 Dermatitis, unspecified: Secondary | ICD-10-CM | POA: Diagnosis not present

## 2021-04-07 DIAGNOSIS — Z86010 Personal history of colonic polyps: Secondary | ICD-10-CM | POA: Diagnosis not present

## 2021-04-07 MED ORDER — AMCINONIDE 0.1 % EX CREA: TOPICAL_CREAM | Freq: Two times a day (BID) | CUTANEOUS | 3 refills | Status: DC

## 2021-04-07 NOTE — Assessment & Plan Note (Addendum)
Stable.  Continue Lamictal 100 mg daily.  Recent brief near syncopal episodes may be related to this.  Hypoglycemia is a possibility.  Advised to have breakfast every day and do not skip any meals.  Continue taking medications as prescribed.  We will follow-up with neurologist.

## 2021-04-07 NOTE — Patient Instructions (Signed)
Health Maintenance, Female Adopting a healthy lifestyle and getting preventive care are important in promoting health and wellness. Ask your health care provider about: The right schedule for you to have regular tests and exams. Things you can do on your own to prevent diseases and keep yourself healthy. What should I know about diet, weight, and exercise? Eat a healthy diet  Eat a diet that includes plenty of vegetables, fruits, low-fat dairy products, and lean protein. Do not eat a lot of foods that are high in solid fats, added sugars, or sodium. Maintain a healthy weight Body mass index (BMI) is used to identify weight problems. It estimates body fat based on height and weight. Your health care provider can help determine your BMI and help you achieve or maintain a healthy weight. Get regular exercise Get regular exercise. This is one of the most important things you can do for your health. Most adults should: Exercise for at least 150 minutes each week. The exercise should increase your heart rate and make you sweat (moderate-intensity exercise). Do strengthening exercises at least twice a week. This is in addition to the moderate-intensity exercise. Spend less time sitting. Even light physical activity can be beneficial. Watch cholesterol and blood lipids Have your blood tested for lipids and cholesterol at 60 years of age, then have this test every 5 years. Have your cholesterol levels checked more often if: Your lipid or cholesterol levels are high. You are older than 60 years of age. You are at high risk for heart disease. What should I know about cancer screening? Depending on your health history and family history, you may need to have cancer screening at various ages. This may include screening for: Breast cancer. Cervical cancer. Colorectal cancer. Skin cancer. Lung cancer. What should I know about heart disease, diabetes, and high blood pressure? Blood pressure and heart  disease High blood pressure causes heart disease and increases the risk of stroke. This is more likely to develop in people who have high blood pressure readings, are of African descent, or are overweight. Have your blood pressure checked: Every 3-5 years if you are 18-39 years of age. Every year if you are 40 years old or older. Diabetes Have regular diabetes screenings. This checks your fasting blood sugar level. Have the screening done: Once every three years after age 40 if you are at a normal weight and have a low risk for diabetes. More often and at a younger age if you are overweight or have a high risk for diabetes. What should I know about preventing infection? Hepatitis B If you have a higher risk for hepatitis B, you should be screened for this virus. Talk with your health care provider to find out if you are at risk for hepatitis B infection. Hepatitis C Testing is recommended for: Everyone born from 1945 through 1965. Anyone with known risk factors for hepatitis C. Sexually transmitted infections (STIs) Get screened for STIs, including gonorrhea and chlamydia, if: You are sexually active and are younger than 60 years of age. You are older than 60 years of age and your health care provider tells you that you are at risk for this type of infection. Your sexual activity has changed since you were last screened, and you are at increased risk for chlamydia or gonorrhea. Ask your health care provider if you are at risk. Ask your health care provider about whether you are at high risk for HIV. Your health care provider may recommend a prescription medicine   to help prevent HIV infection. If you choose to take medicine to prevent HIV, you should first get tested for HIV. You should then be tested every 3 months for as long as you are taking the medicine. Pregnancy If you are about to stop having your period (premenopausal) and you may become pregnant, seek counseling before you get  pregnant. Take 400 to 800 micrograms (mcg) of folic acid every day if you become pregnant. Ask for birth control (contraception) if you want to prevent pregnancy. Osteoporosis and menopause Osteoporosis is a disease in which the bones lose minerals and strength with aging. This can result in bone fractures. If you are 65 years old or older, or if you are at risk for osteoporosis and fractures, ask your health care provider if you should: Be screened for bone loss. Take a calcium or vitamin D supplement to lower your risk of fractures. Be given hormone replacement therapy (HRT) to treat symptoms of menopause. Follow these instructions at home: Lifestyle Do not use any products that contain nicotine or tobacco, such as cigarettes, e-cigarettes, and chewing tobacco. If you need help quitting, ask your health care provider. Do not use street drugs. Do not share needles. Ask your health care provider for help if you need support or information about quitting drugs. Alcohol use Do not drink alcohol if: Your health care provider tells you not to drink. You are pregnant, may be pregnant, or are planning to become pregnant. If you drink alcohol: Limit how much you use to 0-1 drink a day. Limit intake if you are breastfeeding. Be aware of how much alcohol is in your drink. In the [place] one drink equals one 12 oz bottle of beer (355 mL), one 5 oz glass of wine (148 mL), or one 1 oz glass of hard liquor (44 mL). General instructions Schedule regular health, dental, and eye exams. Stay current with your vaccines. Tell your health care provider if: You often feel depressed. You have ever been abused or do not feel safe at home. Summary Adopting a healthy lifestyle and getting preventive care are important in promoting health and wellness. Follow your health care provider's instructions about healthy diet, exercising, and getting tested or screened for diseases. Follow your health care provider's  instructions on monitoring your cholesterol and blood pressure. This information is not intended to replace advice given to you by your health care provider. Make sure you discuss any questions you have with your health care provider. Document Revised: 09/04/2020 Document Reviewed: 06/20/2018 Elsevier Patient Education  2022 [employer]  

## 2021-04-07 NOTE — Progress Notes (Signed)
Jane Heath 60 y.o.   Chief Complaint  Patient presents with   Rash    Pt states she would like a refill on cream for eczema. Pt would like another referral to GI for colonoscopy.    HISTORY OF PRESENT ILLNESS: This is a 60 y.o. female with history of colonic polyps first diagnosed at age 8.  Needs referral for surveillance colonoscopy. Also has history of chronic eczema to hands.  Requesting refill on prescription cream. Has history of seizure disorder on Lamictal 100 mg once a day.  Had 2 episodes the last several weeks when she suddenly felt lightheaded and dizzy.  Lasted several seconds and spontaneously resolved.  No loss of consciousness.  No seizure activity.  They happened at work around 8 in the morning on an empty stomach.  Thinks her sugar was low.  Ate something and felt better. No other complaints or medical concerns today.  Rash Pertinent negatives include no congestion, cough, diarrhea, fever, shortness of breath, sore throat or vomiting.    Prior to Admission medications   Medication Sig Start Date End Date Taking? Authorizing Provider  amcinonide (CYCLOCORT) 0.1 % cream Apply topically 2 (two) times daily.   Yes [provider]  lamoTRIgine (LAMICTAL) 100 MG tablet Take 100 mg twice daily 02/10/20  Yes Penumalli, Vikram R, MD  triamcinolone cream (KENALOG) 0.1 % Apply 1 application topically 2 (two) times daily.   Yes [provider]  metFORMIN (GLUCOPHAGE) 500 MG tablet Take 500 mg by mouth daily.  07/21/19  [provider]    No Known Allergies  Patient Active Problem List   Diagnosis Date Noted   Tear of medial meniscus of knee 04/07/2021   COVID-19 01/28/2021   Acute bronchitis 01/28/2021   Seizure disorder ([place]) 01/02/2020   Flu-like symptoms 06/06/2017   Influenza due to identified novel influenza A virus with other respiratory manifestations 06/06/2017   Laryngitis 06/06/2017    Past Medical History:  Diagnosis Date    Bronchitis    COVID-19 virus infection 07/2019   OSA (obstructive sleep apnea)    Seizure ([place]) 06/2019   most recent 12/19/19    Past Surgical History:  Procedure Laterality Date   ABDOMINAL HYSTERECTOMY     1987   BREAST REDUCTION SURGERY  2004   REDUCTION MAMMAPLASTY Bilateral    tummy tuck  2008    Social History   Socioeconomic History   Marital status: Single    Spouse name: Not on file   Number of children: 2   Years of education: Not on file   Highest education level: Not on file  Occupational History    Comment: third shift  Tobacco Use   Smoking status: Never   Smokeless tobacco: Never  Substance and Sexual Activity   Alcohol use: Yes    Comment: 2 x week   Drug use: Never   Sexual activity: Not on file  Other Topics Concern   Not on file  Social History Narrative   Lives with mother.  Works for [employer].  Education college 2 yrs.  Divorced.  No children.  Caffeine 12-16 oz daily.   Social Determinants of Health   Financial Resource Strain: Not on file  Food Insecurity: Not on file  Transportation Needs: Not on file  Physical Activity: Not on file  Stress: Not on file  Social Connections: Not on file  Intimate Partner Violence: Not on file    Family History  Problem Relation Age of Onset  Hypertension Mother    Heart disease Mother    Hypertension Sister    Diabetes Brother    Hypertension Brother    Heart disease Maternal Grandmother    Alzheimer's disease Other      Review of Systems  Constitutional: Negative.  Negative for chills and fever.  HENT: Negative.  Negative for congestion and sore throat.   Respiratory: Negative.  Negative for cough and shortness of breath.   Cardiovascular:  Negative for chest pain and palpitations.  Gastrointestinal: Negative.  Negative for abdominal pain, diarrhea, nausea and vomiting.  Genitourinary: Negative.   Musculoskeletal: Negative.   Skin:  Positive for rash.  Neurological:  Negative for  dizziness and headaches.  All other systems reviewed and are negative.  Today's Vitals   04/07/21 1003  BP: 132/86  Pulse: 70  Temp: 97.8 F (36.6 C)  TempSrc: Oral  SpO2: 98%  Weight: 168 lb (76.2 kg)  Height: 5\' 2"  (1.575 m)   Body mass index is 30.73 kg/m.  Physical Exam Vitals reviewed.  Constitutional:      Appearance: Normal appearance.  HENT:     Head: Normocephalic.  Eyes:     Extraocular Movements: Extraocular movements intact.     Conjunctiva/sclera: Conjunctivae normal.     Pupils: Pupils are equal, round, and reactive to light.  Cardiovascular:     Rate and Rhythm: Normal rate and regular rhythm.     Pulses: Normal pulses.     Heart sounds: Normal heart sounds.  Pulmonary:     Effort: Pulmonary effort is normal.     Breath sounds: Normal breath sounds.  Musculoskeletal:        General: Normal range of motion.     Cervical back: Normal range of motion and neck supple.     Right lower leg: No edema.     Left lower leg: No edema.  Skin:    General: Skin is warm and dry.  Neurological:     General: No focal deficit present.     Mental Status: She is alert and oriented to person, place, and time.  Psychiatric:        Mood and Affect: Mood normal.        Behavior: Behavior normal.     ASSESSMENT & PLAN: Jane Heath was seen today for rash.  Diagnoses and all orders for this visit:  Seizure disorder ([place])  Need for shingles vaccine -     Varicella-zoster vaccine IM (Shingrix)  Need for influenza vaccination -     Flu Vaccine QUAD 32mo+IM (Fluarix, Fluzone & Alfiuria Quad PF)  Colon cancer screening -     Ambulatory referral to Gastroenterology  Hx of colonic polyps -     Ambulatory referral to Gastroenterology  Eczema, unspecified type -     amcinonide (CYCLOCORT) 0.1 % cream; Apply topically 2 (two) times daily.  Seizure disorder ([name]) Stable.  Continue Lamictal 100 mg daily.  Recent brief near syncopal episodes may be related to this.   Hypoglycemia is a possibility.  Advised to have breakfast every day and do not skip any meals.  Continue taking medications as prescribed.  We will follow-up with neurologist.  Patient Instructions  Health Maintenance, Female Adopting a healthy lifestyle and getting preventive care are important in promoting health and wellness. Ask your health care provider about: The right schedule for you to have regular tests and exams. Things you can do on your own to prevent diseases and keep yourself healthy. What should I know  about diet, weight, and exercise? Eat a healthy diet  Eat a diet that includes plenty of vegetables, fruits, low-fat dairy products, and lean protein. Do not eat a lot of foods that are high in solid fats, added sugars, or sodium. Maintain a healthy weight Body mass index (BMI) is used to identify weight problems. It estimates body fat based on height and weight. Your health care provider can help determine your BMI and help you achieve or maintain a healthy weight. Get regular exercise Get regular exercise. This is one of the most important things you can do for your health. Most adults should: Exercise for at least 150 minutes each week. The exercise should increase your heart rate and make you sweat (moderate-intensity exercise). Do strengthening exercises at least twice a week. This is in addition to the moderate-intensity exercise. Spend less time sitting. Even light physical activity can be beneficial. Watch cholesterol and blood lipids Have your blood tested for lipids and cholesterol at 60 years of age, then have this test every 5 years. Have your cholesterol levels checked more often if: Your lipid or cholesterol levels are high. You are older than 60 years of age. You are at high risk for heart disease. What should I know about cancer screening? Depending on your health history and family history, you may need to have cancer screening at various ages. This may  include screening for: Breast cancer. Cervical cancer. Colorectal cancer. Skin cancer. Lung cancer. What should I know about heart disease, diabetes, and high blood pressure? Blood pressure and heart disease High blood pressure causes heart disease and increases the risk of stroke. This is more likely to develop in people who have high blood pressure readings, are of African descent, or are overweight. Have your blood pressure checked: Every 3-5 years if you are 23-77 years of age. Every year if you are 58 years old or older. Diabetes Have regular diabetes screenings. This checks your fasting blood sugar level. Have the screening done: Once every three years after age 69 if you are at a normal weight and have a low risk for diabetes. More often and at a younger age if you are overweight or have a high risk for diabetes. What should I know about preventing infection? Hepatitis B If you have a higher risk for hepatitis B, you should be screened for this virus. Talk with your health care provider to find out if you are at risk for hepatitis B infection. Hepatitis C Testing is recommended for: Everyone born from [birth year] through 1965. Anyone with known risk factors for hepatitis C. Sexually transmitted infections (STIs) Get screened for STIs, including gonorrhea and chlamydia, if: You are sexually active and are younger than 60 years of age. You are older than 60 years of age and your health care provider tells you that you are at risk for this type of infection. Your sexual activity has changed since you were last screened, and you are at increased risk for chlamydia or gonorrhea. Ask your health care provider if you are at risk. Ask your health care provider about whether you are at high risk for HIV. Your health care provider may recommend a prescription medicine to help prevent HIV infection. If you choose to take medicine to prevent HIV, you should first get tested for HIV. You should then  be tested every 3 months for as long as you are taking the medicine. Pregnancy If you are about to stop having your period (premenopausal) and you may  become pregnant, seek counseling before you get pregnant. Take 400 to 800 micrograms (mcg) of folic acid every day if you become pregnant. Ask for birth control (contraception) if you want to prevent pregnancy. Osteoporosis and menopause Osteoporosis is a disease in which the bones lose minerals and strength with aging. This can result in bone fractures. If you are 57 years old or older, or if you are at risk for osteoporosis and fractures, ask your health care provider if you should: Be screened for bone loss. Take a calcium or vitamin D supplement to lower your risk of fractures. Be given hormone replacement therapy (HRT) to treat symptoms of menopause. Follow these instructions at home: Lifestyle Do not use any products that contain nicotine or tobacco, such as cigarettes, e-cigarettes, and chewing tobacco. If you need help quitting, ask your health care provider. Do not use street drugs. Do not share needles. Ask your health care provider for help if you need support or information about quitting drugs. Alcohol use Do not drink alcohol if: Your health care provider tells you not to drink. You are pregnant, may be pregnant, or are planning to become pregnant. If you drink alcohol: Limit how much you use to 0-1 drink a day. Limit intake if you are breastfeeding. Be aware of how much alcohol is in your drink. In the [place] one drink equals one 12 oz bottle of beer (355 mL), one 5 oz glass of wine (148 mL), or one 1 oz glass of hard liquor (44 mL). General instructions Schedule regular health, dental, and eye exams. Stay current with your vaccines. Tell your health care provider if: You often feel depressed. You have ever been abused or do not feel safe at home. Summary Adopting a healthy lifestyle and getting preventive care are  important in promoting health and wellness. Follow your health care provider's instructions about healthy diet, exercising, and getting tested or screened for diseases. Follow your health care provider's instructions on monitoring your cholesterol and blood pressure. This information is not intended to replace advice given to you by your health care provider. Make sure you discuss any questions you have with your health care provider. Document Revised: 09/04/2020 Document Reviewed: 06/20/2018 Elsevier Patient Education  2022 [place], MD [facility]

## 2021-04-20 ENCOUNTER — Other Ambulatory Visit: Payer: Self-pay | Admitting: Diagnostic Neuroimaging

## 2021-04-20 DIAGNOSIS — R569 Unspecified convulsions: Secondary | ICD-10-CM

## 2021-04-21 ENCOUNTER — Encounter: Payer: Self-pay | Admitting: *Deleted

## 2021-04-26 ENCOUNTER — Other Ambulatory Visit: Payer: Self-pay

## 2021-04-26 DIAGNOSIS — L309 Dermatitis, unspecified: Secondary | ICD-10-CM

## 2021-04-26 MED ORDER — AMCINONIDE 0.1 % EX CREA: TOPICAL_CREAM | Freq: Two times a day (BID) | CUTANEOUS | 3 refills | Status: DC

## 2021-04-27 ENCOUNTER — Other Ambulatory Visit: Payer: Self-pay | Admitting: Diagnostic Neuroimaging

## 2021-06-02 ENCOUNTER — Other Ambulatory Visit: Payer: Self-pay

## 2021-06-02 ENCOUNTER — Encounter: Payer: 59 | Attending: Psychology | Admitting: Psychology

## 2021-06-02 ENCOUNTER — Encounter: Payer: Self-pay | Admitting: Psychology

## 2021-06-02 DIAGNOSIS — G40909 Epilepsy, unspecified, not intractable, without status epilepticus: Secondary | ICD-10-CM | POA: Diagnosis present

## 2021-06-02 DIAGNOSIS — R413 Other amnesia: Secondary | ICD-10-CM | POA: Diagnosis present

## 2021-06-02 DIAGNOSIS — G4733 Obstructive sleep apnea (adult) (pediatric): Secondary | ICD-10-CM | POA: Diagnosis present

## 2021-06-02 DIAGNOSIS — G3184 Mild cognitive impairment, so stated: Secondary | ICD-10-CM | POA: Insufficient documentation
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
## 2021-06-08 ENCOUNTER — Encounter: Payer: 59 | Admitting: Psychology

## 2021-06-08 ENCOUNTER — Other Ambulatory Visit: Payer: Self-pay

## 2021-06-08 DIAGNOSIS — R413 Other amnesia: Secondary | ICD-10-CM | POA: Diagnosis not present

## 2021-06-08 DIAGNOSIS — G3184 Mild cognitive impairment, so stated: Secondary | ICD-10-CM

## 2021-06-08 DIAGNOSIS — G40909 Epilepsy, unspecified, not intractable, without status epilepticus: Secondary | ICD-10-CM | POA: Diagnosis not present

## 2021-06-08 DIAGNOSIS — G4733 Obstructive sleep apnea (adult) (pediatric): Secondary | ICD-10-CM | POA: Diagnosis not present

## 2021-06-10 ENCOUNTER — Encounter: Payer: Self-pay | Admitting: Psychology

## 2021-06-10 ENCOUNTER — Encounter: Payer: Self-pay | Admitting: Gastroenterology
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
## 2021-06-18 ENCOUNTER — Ambulatory Visit: Payer: 59

## 2021-06-21 ENCOUNTER — Other Ambulatory Visit: Payer: Self-pay

## 2021-06-21 ENCOUNTER — Ambulatory Visit (INDEPENDENT_AMBULATORY_CARE_PROVIDER_SITE_OTHER): Payer: 59

## 2021-06-21 DIAGNOSIS — Z23 Encounter for immunization: Secondary | ICD-10-CM

## 2021-06-21 NOTE — Progress Notes (Signed)
Pt given 2nd shingles vacc w/o any complications.

## 2021-06-23 IMAGING — MG DIGITAL SCREENING BILAT W/ TOMO W/ CAD
6 of 10 series · 6 of 30 positions shown · non-contrast
Comparison: Previous exam(s).

CLINICAL DATA: Screening.

EXAM:
DIGITAL SCREENING BILATERAL MAMMOGRAM WITH TOMO AND CAD

[L CC synth-2D]
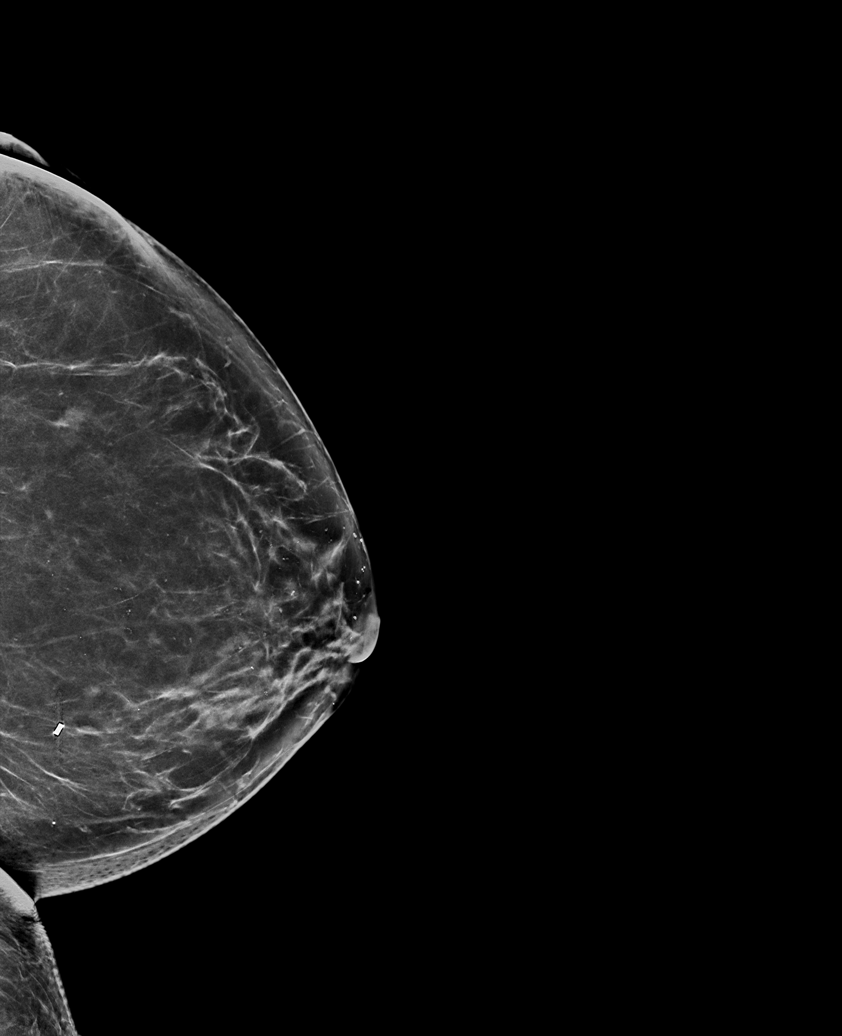

[R CC synth-2D (1 of 2)]
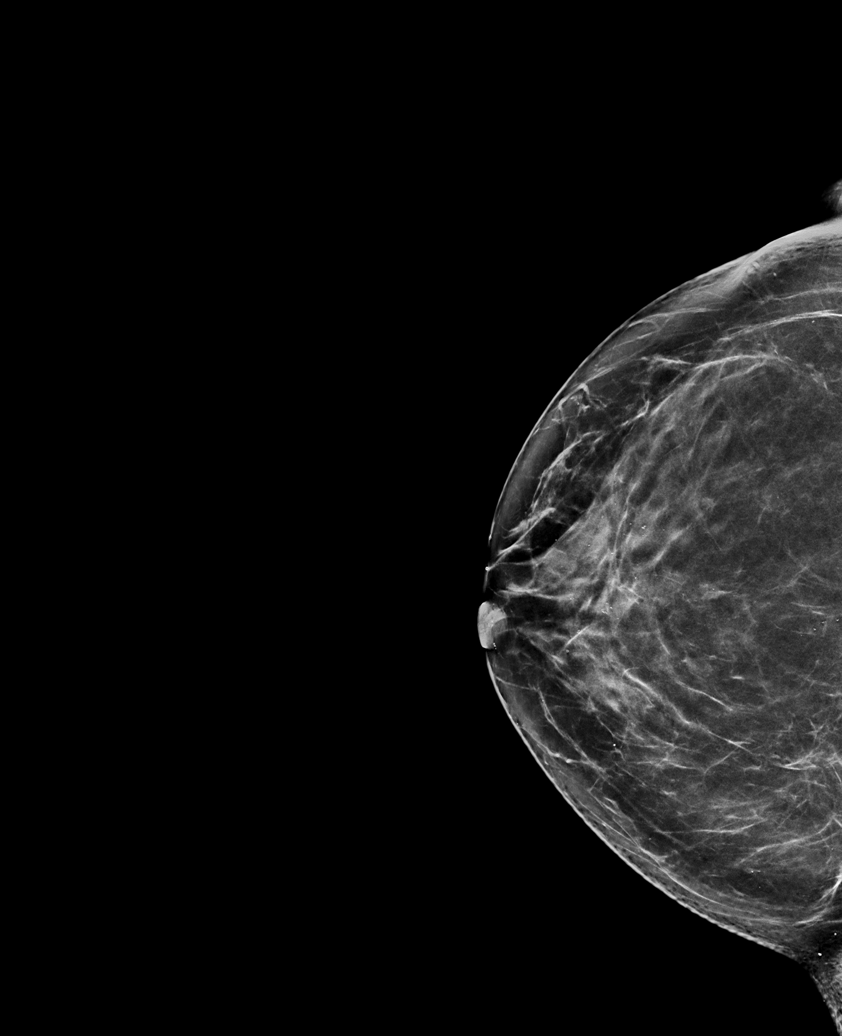

[L MLO synth-2D]
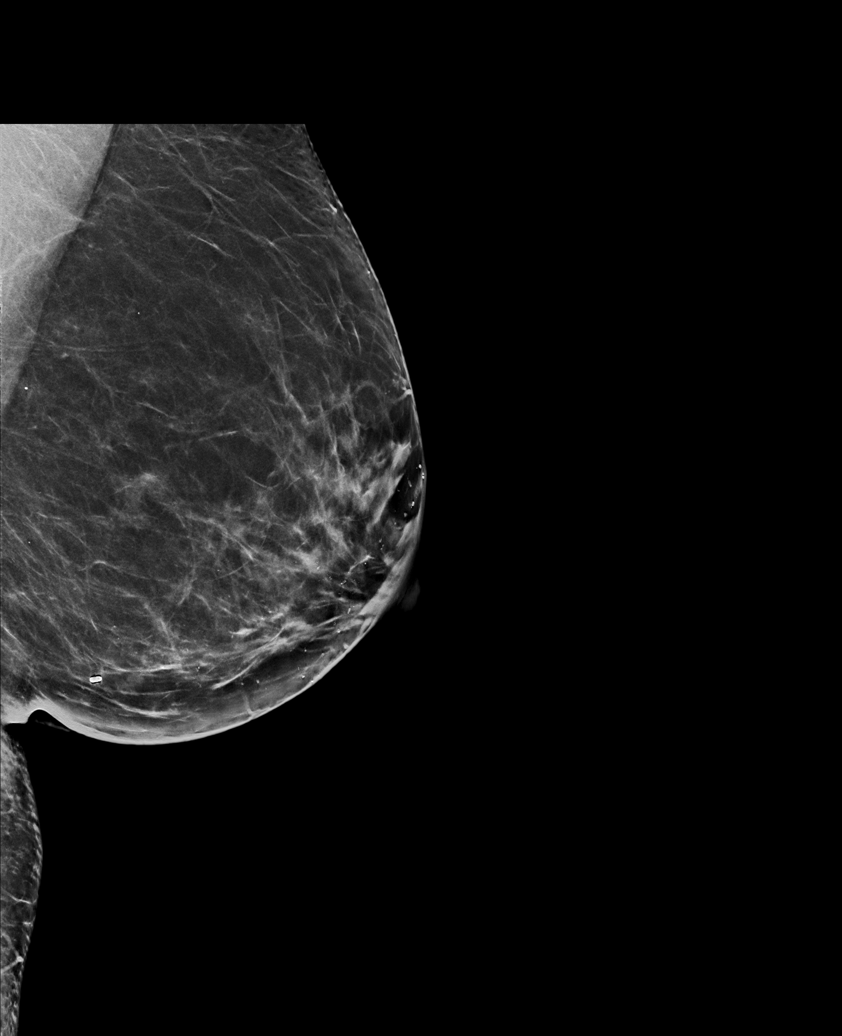

[R CC synth-2D (2 of 2)]
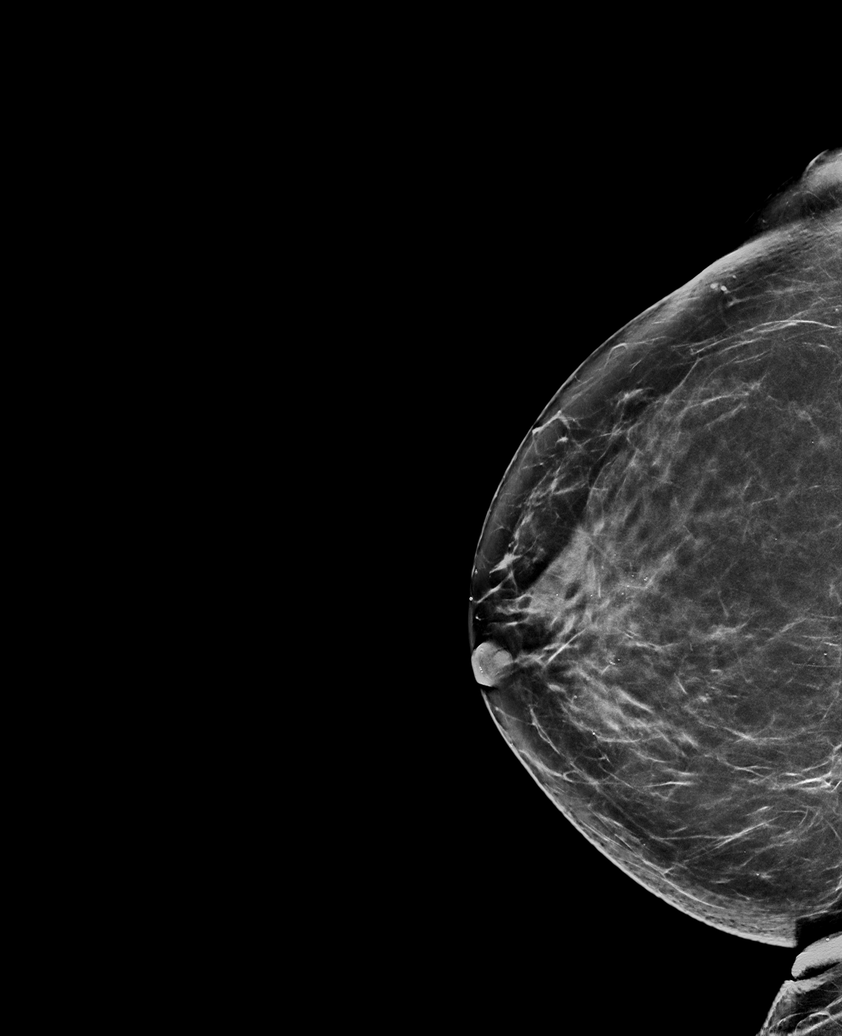

[R MLO synth-2D]
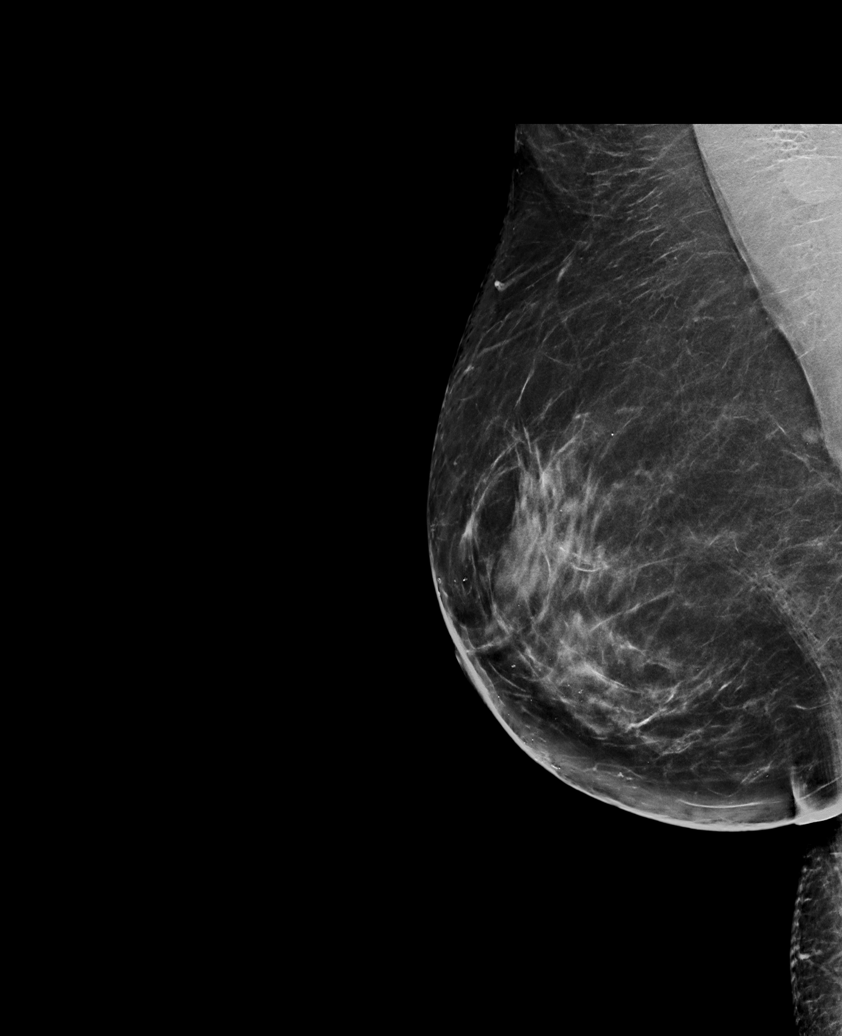

[R CC tomo · tomo slice 38/75.0]
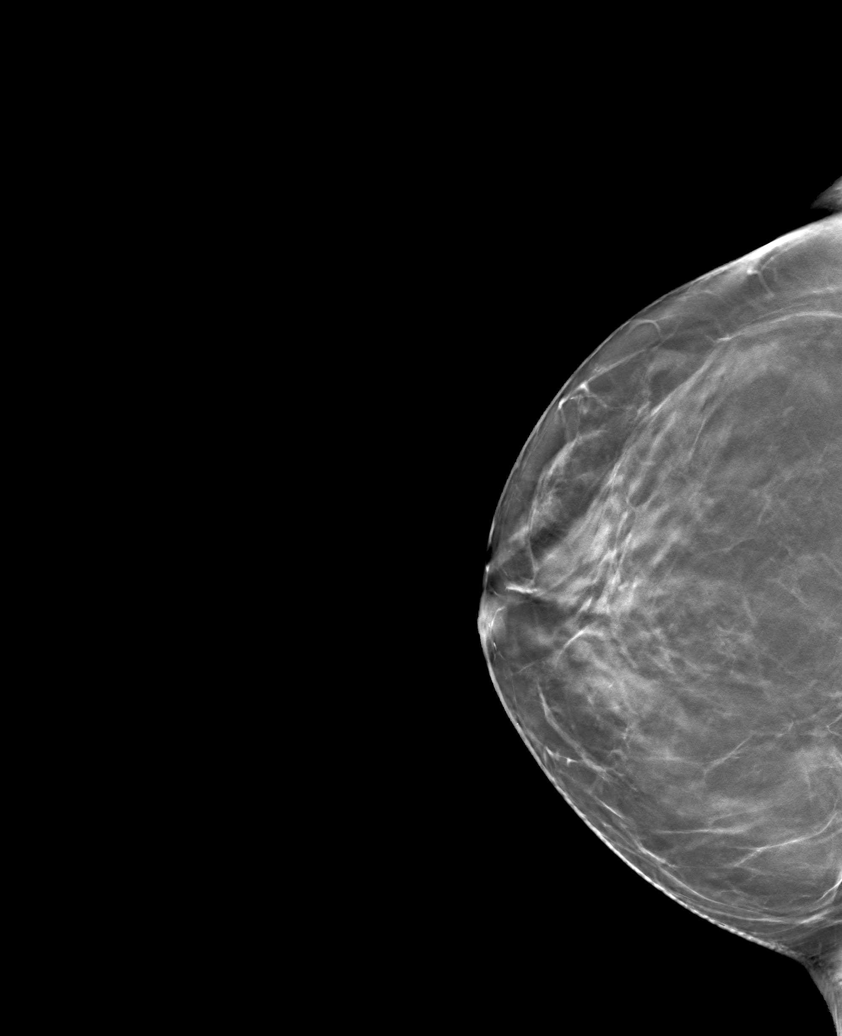

[6 of 30 positions shown; findings below may reference images not displayed]

ACR Breast Density Category b: There are scattered areas of
fibroglandular density.
FINDINGS: There are no findings suspicious for malignancy. Images were
processed with CAD.
IMPRESSION: No mammographic evidence of malignancy. A result letter of this
screening mammogram will be mailed directly to the patient.

RECOMMENDATION:
Screening mammogram in one year. (Code:[id number])

BI-RADS CATEGORY  1: Negative.

## 2021-07-01 ENCOUNTER — Other Ambulatory Visit: Payer: Self-pay

## 2021-07-01 ENCOUNTER — Ambulatory Visit (AMBULATORY_SURGERY_CENTER): Payer: 59 | Admitting: *Deleted

## 2021-07-01 VITALS — Ht 63.0 in | Wt 172.0 lb

## 2021-07-01 DIAGNOSIS — Z1211 Encounter for screening for malignant neoplasm of colon: Secondary | ICD-10-CM

## 2021-07-01 MED ORDER — NA SULFATE-K SULFATE-MG SULF 17.5-3.13-1.6 GM/177ML PO SOLN: 1.0000 | Freq: Once | ORAL | 0 refills | Status: AC

## 2021-07-01 NOTE — Progress Notes (Signed)
No egg or soy allergy known to patient  No issues known to pt with past sedation with any surgeries or procedures Patient denies ever being told they had issues or difficulty with intubation  No FH of Malignant Hyperthermia Pt is not on diet pills Pt is not on  home 02  Pt is not on blood thinners  Pt states  issues with constipation at times- uses Miralax PRN- pt states she does have to strain at times  No A fib or A flutter  Pt is fully vaccinated  for Covid   Last documented seizure 04-2020 per pt - takes Lamictal once daily   Pt has had 2 prev colon's in [place]- last 10 > yrs ago pt states has had polyps-? Path  Pharmacy and  NO PA's for preps discussed with pt In PV today  Discussed with pt there will be an out-of-pocket cost for prep and that varies from $0 to 70 +  dollars - pt verbalized understanding   Due to the COVID-19 pandemic we are asking patients to follow certain guidelines in PV and the [facility]   Pt aware of COVID protocols and [facility] guidelines   PV completed over the phone. Pt verified name, DOB, address and insurance during PV today.  Pt mailed instruction packet with copy of consent form to read and not return, and instructions.   Pt encouraged to call with questions or issues.  If pt has My chart, procedure instructions sent via My Chart

## 2021-07-16 ENCOUNTER — Encounter: Payer: Self-pay | Admitting: Gastroenterology

## 2021-07-19 ENCOUNTER — Encounter: Payer: Self-pay | Admitting: Certified Registered Nurse Anesthetist

## 2021-07-20 ENCOUNTER — Other Ambulatory Visit: Payer: Self-pay

## 2021-07-20 ENCOUNTER — Ambulatory Visit (AMBULATORY_SURGERY_CENTER): Payer: 59 | Admitting: Gastroenterology

## 2021-07-20 ENCOUNTER — Encounter: Payer: Self-pay | Admitting: Gastroenterology

## 2021-07-20 VITALS — BP 131/78 | HR 64 | Temp 97.8°F | Resp 15 | Ht 62.0 in | Wt 172.0 lb

## 2021-07-20 DIAGNOSIS — K388 Other specified diseases of appendix: Secondary | ICD-10-CM | POA: Diagnosis not present

## 2021-07-20 DIAGNOSIS — D120 Benign neoplasm of cecum: Secondary | ICD-10-CM

## 2021-07-20 DIAGNOSIS — K635 Polyp of colon: Secondary | ICD-10-CM

## 2021-07-20 DIAGNOSIS — Z1211 Encounter for screening for malignant neoplasm of colon: Secondary | ICD-10-CM

## 2021-07-20 DIAGNOSIS — D121 Benign neoplasm of appendix: Secondary | ICD-10-CM

## 2021-07-20 MED ORDER — SODIUM CHLORIDE 0.9 % IV SOLN: 500.0000 mL | Freq: Once | INTRAVENOUS | Status: DC

## 2021-07-20 NOTE — Progress Notes (Signed)
Called to room to assist during endoscopic procedure.  Patient ID and intended procedure confirmed with present staff. Received instructions for my participation in the procedure from the performing physician.  

## 2021-07-20 NOTE — Progress Notes (Signed)
Referring Provider: Horald Pollen, * Primary Care Physician:  Horald Pollen, MD  Reason for Procedure:  Colon cancer screening   IMPRESSION:  Need for colon cancer screening Appropriate candidate for monitored anesthesia care  PLAN: Colonoscopy in the [facility] today   HPI: Jane Heath is a 61 y.o. female presents for screening colonoscopy.  No prior colonoscopy or colon cancer screening.  No baseline GI symptoms.   No known family history of colon cancer or polyps. No family history of uterine/endometrial cancer, pancreatic cancer or gastric/stomach cancer.   Past Medical History:  Diagnosis Date   Bronchitis    COVID-19 virus infection 07/2019   OSA (obstructive sleep apnea)    no cpap- last sleep study told no OSA   Seizure ([place]) 06/2019   07-01-2021 last seizure 04-2020    Past Surgical History:  Procedure Laterality Date   ABDOMINAL HYSTERECTOMY     1987   BREAST REDUCTION SURGERY  2004   COLONOSCOPY     last colon > 10 yrs ago- last 2 colon's pt states had polyps- we have no records , no path   POLYPECTOMY     REDUCTION MAMMAPLASTY Bilateral    tummy tuck  2008    Current Outpatient Medications  Medication Sig Dispense Refill   amcinonide (CYCLOCORT) 0.1 % cream Apply topically 2 (two) times daily. 30 g 3   erythromycin ophthalmic ointment SMARTSIG:Sparingly Right Eye 6 Times Daily     lamoTRIgine (LAMICTAL) 100 MG tablet Take 1 tablet by mouth twice daily (Patient taking differently: Take 100 mg by mouth daily. Take 1 tablet by mouth twice daily) 60 tablet 0   polyethylene glycol (MIRALAX / GLYCOLAX) 17 g packet Take 17 g by mouth daily as needed.     triamcinolone cream (KENALOG) 0.1 % Apply 1 application topically 2 (two) times daily.     Current Facility-Administered Medications  Medication Dose Route Frequency Provider Last Rate Last Admin   0.9 %  sodium chloride infusion  500 mL Intravenous Once [place], MD         Allergies as of 07/20/2021   (No Known Allergies)    Family History  Problem Relation Age of Onset   Hypertension Mother    Heart disease Mother    Hypertension Sister    Diabetes Brother    Hypertension Brother    Colon cancer Paternal Aunt    Heart disease Maternal Grandmother    Alzheimer's disease Other    Colon polyps Neg Hx    Esophageal cancer Neg Hx    Rectal cancer Neg Hx    Stomach cancer Neg Hx      Physical Exam: General:   Alert,  well-nourished, pleasant and cooperative in NAD Head:  Normocephalic and atraumatic. Eyes:  Sclera clear, no icterus.   Conjunctiva pink. Mouth:  No deformity or lesions.   Neck:  Supple; no masses or thyromegaly. Lungs:  Clear throughout to auscultation.   No wheezes. Heart:  Regular rate and rhythm; no murmurs. Abdomen:  Soft, non-tender, nondistended, normal bowel sounds, no rebound or guarding.  Msk:  Symmetrical. No boney deformities LAD: No inguinal or umbilical LAD Extremities:  No clubbing or edema. Neurologic:  Alert and  oriented x4;  grossly nonfocal Skin:  No obvious rash or bruise. Psych:  Alert and cooperative. Normal mood and affect.     Studies/Results: No results found.    Destenee Guerry L. Tarri Glenn, MD, MPH 07/20/2021, 9:15 AM

## 2021-07-20 NOTE — Progress Notes (Signed)
CNW - VS   

## 2021-07-20 NOTE — Progress Notes (Signed)
° °  Referring Provider: Horald Pollen, * Primary Care Physician:  Horald Pollen, MD  Reason for Procedure:  Colon cancer screening   IMPRESSION:  Need for colon cancer screening Appropriate candidate for monitored anesthesia care  PLAN: Colonoscopy in the [facility] today   HPI: Jane Heath is a 61 y.o. female presents for screening colonoscopy.  Colonoscopy in [place] >10 years ago. She may have had polyps.   No baseline GI symptoms.   No known family history of colon cancer or polyps. No family history of uterine/endometrial cancer, pancreatic cancer or gastric/stomach cancer.   Past Medical History:  Diagnosis Date   Bronchitis    COVID-19 virus infection 07/2019   OSA (obstructive sleep apnea)    no cpap- last sleep study told no OSA   Seizure ([place]) 06/2019   07-01-2021 last seizure 04-2020    Past Surgical History:  Procedure Laterality Date   ABDOMINAL HYSTERECTOMY     1987   BREAST REDUCTION SURGERY  2004   COLONOSCOPY     last colon > 10 yrs ago- last 2 colon's pt states had polyps- we have no records , no path   POLYPECTOMY     REDUCTION MAMMAPLASTY Bilateral    tummy tuck  2008    Current Outpatient Medications  Medication Sig Dispense Refill   amcinonide (CYCLOCORT) 0.1 % cream Apply topically 2 (two) times daily. 30 g 3   erythromycin ophthalmic ointment SMARTSIG:Sparingly Right Eye 6 Times Daily     lamoTRIgine (LAMICTAL) 100 MG tablet Take 1 tablet by mouth twice daily (Patient taking differently: Take 100 mg by mouth daily. Take 1 tablet by mouth twice daily) 60 tablet 0   polyethylene glycol (MIRALAX / GLYCOLAX) 17 g packet Take 17 g by mouth daily as needed.     triamcinolone cream (KENALOG) 0.1 % Apply 1 application topically 2 (two) times daily.     No current facility-administered medications for this visit.    Allergies as of 07/20/2021   (No Known Allergies)    Family History  Problem Relation Age of Onset    Hypertension Mother    Heart disease Mother    Hypertension Sister    Diabetes Brother    Hypertension Brother    Colon cancer Paternal Aunt    Heart disease Maternal Grandmother    Alzheimer's disease Other    Colon polyps Neg Hx    Esophageal cancer Neg Hx    Rectal cancer Neg Hx    Stomach cancer Neg Hx      Physical Exam: General:   Alert,  well-nourished, pleasant and cooperative in NAD Head:  Normocephalic and atraumatic. Eyes:  Sclera clear, no icterus.   Conjunctiva pink. Mouth:  No deformity or lesions.   Neck:  Supple; no masses or thyromegaly. Lungs:  Clear throughout to auscultation.   No wheezes. Heart:  Regular rate and rhythm; no murmurs. Abdomen:  Soft, non-tender, nondistended, normal bowel sounds, no rebound or guarding.  Msk:  Symmetrical. No boney deformities LAD: No inguinal or umbilical LAD Extremities:  No clubbing or edema. Neurologic:  Alert and  oriented x4;  grossly nonfocal Skin:  No obvious rash or bruise. Psych:  Alert and cooperative. Normal mood and affect.     Studies/Results: No results found.    Salli Bodin L. Tarri Glenn, MD, MPH 07/20/2021, 9:51 AM

## 2021-07-20 NOTE — Progress Notes (Signed)
Report given to PACU, vss 

## 2021-07-20 NOTE — Patient Instructions (Signed)
Please read handouts provided. Continue present medications. Await pathology results. High Fiber Diet.   YOU HAD AN ENDOSCOPIC PROCEDURE TODAY AT [facility]:   Refer to the procedure report that was given to you for any specific questions about what was found during the examination.  If the procedure report does not answer your questions, please call your gastroenterologist to clarify.  If you requested that your care partner not be given the details of your procedure findings, then the procedure report has been included in a sealed envelope for you to review at your convenience later.  YOU SHOULD EXPECT: Some feelings of bloating in the abdomen. Passage of more gas than usual.  Walking can help get rid of the air that was put into your GI tract during the procedure and reduce the bloating. If you had a lower endoscopy (such as a colonoscopy or flexible sigmoidoscopy) you may notice spotting of blood in your stool or on the toilet paper. If you underwent a bowel prep for your procedure, you may not have a normal bowel movement for a few days.  Please Note:  You might notice some irritation and congestion in your nose or some drainage.  This is from the oxygen used during your procedure.  There is no need for concern and it should clear up in a day or so.  SYMPTOMS TO REPORT IMMEDIATELY:  Following lower endoscopy (colonoscopy or flexible sigmoidoscopy):  Excessive amounts of blood in the stool  Significant tenderness or worsening of abdominal pains  Swelling of the abdomen that is new, acute  Fever of 100F or higher   For urgent or emergent issues, a gastroenterologist can be reached at any hour by calling [phone]. Do not use MyChart messaging for urgent concerns.    DIET:  We do recommend a small meal at first, but then you may proceed to your regular diet.  Drink plenty of fluids but you should avoid alcoholic beverages for 24 hours.  ACTIVITY:  You should plan  to take it easy for the rest of today and you should NOT DRIVE or use heavy machinery until tomorrow (because of the sedation medicines used during the test).    FOLLOW UP: Our staff will call the number listed on your records 48-72 hours following your procedure to check on you and address any questions or concerns that you may have regarding the information given to you following your procedure. If we do not reach you, we will leave a message.  We will attempt to reach you two times.  During this call, we will ask if you have developed any symptoms of COVID 19. If you develop any symptoms (ie: fever, flu-like symptoms, shortness of breath, cough etc.) before then, please call [phone].  If you test positive for Covid 19 in the 2 weeks post procedure, please call and report this information to [place].    If any biopsies were taken you will be contacted by phone or by letter within the next 1-3 weeks.  Please call us at [phone] if you have not heard about the biopsies in 3 weeks.    SIGNATURES/CONFIDENTIALITY: You and/or your care partner have signed paperwork which will be entered into your electronic medical record.  These signatures attest to the fact that that the information above on your After Visit Summary has been reviewed and is understood.  Full responsibility of the confidentiality of this discharge information lies with you and/or your care-partner.

## 2021-07-20 NOTE — Op Note (Addendum)
[facility] Patient Name: Jane Heath Procedure Date: 07/20/2021 9:17 AM MRN: [id number] Endoscopist: Thornton Park MD, MD Age: 61 Referring MD:  Date of Birth: December 28, 1960 Gender: Female Account #: [id number] Procedure:                Colonoscopy Indications:              Screening for colorectal malignant neoplasm                           Prior colonoscopy in [place] - may have had polyps                           No known family history of colon cancer or polyps Medicines:                Monitored Anesthesia Care Procedure:                Pre-Anesthesia Assessment:                           - Prior to the procedure, a History and Physical                            was performed, and patient medications and                            allergies were reviewed. The patient's tolerance of                            previous anesthesia was also reviewed. The risks                            and benefits of the procedure and the sedation                            options and risks were discussed with the patient.                            All questions were answered, and informed consent                            was obtained. Prior Anticoagulants: The patient has                            taken no previous anticoagulant or antiplatelet                            agents. ASA Grade Assessment: III - A patient with                            severe systemic disease. After reviewing the risks                            and benefits, the patient was deemed in  satisfactory condition to undergo the procedure.                           After obtaining informed consent, the colonoscope                            was passed under direct vision. Throughout the                            procedure, the patient's blood pressure, pulse, and                            oxygen saturations were monitored continuously. The                            CF [id number]  #[id number] was introduced through the anus                            and advanced to the 6 cm into the ileum. A second                            forward view of the right colon was performed. The                            colonoscopy was performed with moderate difficulty                            due to significant looping. Successful completion                            of the procedure was aided by changing the                            patient's position, withdrawing and reinserting the                            scope and applying abdominal pressure. The patient                            tolerated the procedure well. The quality of the                            bowel preparation was good. Scope In: 9:26:17 AM Scope Out: 9:41:20 AM Scope Withdrawal Time: 0 hours 12 minutes 0 seconds  Total Procedure Duration: 0 hours 15 minutes 3 seconds  Findings:                 The perianal and digital rectal examinations were                            normal.                           Non-bleeding internal hemorrhoids were found.  Multiple small and large-mouthed diverticula were                            found in the sigmoid colon and descending colon.                           A diffuse area of mild melanosis was found in the                            entire colon.                           A less than 1 mm polyp was found on the ileocecal                            valve. The polyp was flat. The polyp was removed                            with a cold biopsy forceps. Resection and retrieval                            were complete. Estimated blood loss was minimal.                           A 2 mm polyp was found in the appendiceal orifice.                            The polyp was flat. The polyp was removed with a                            cold snare. Resection and retrieval were complete.                            Estimated blood loss was minimal.                            The distal ileum contained multiple non-bleeding                            erosions. No stigmata of recent bleeding were seen.                            Biopsies were taken with a cold forceps for                            histology. Estimated blood loss was minimal.                           The exam was otherwise without abnormality on                            direct and retroflexion views. Complications:            No  immediate complications. Estimated blood loss:                            Minimal. Estimated Blood Loss:     Estimated blood loss was minimal. Impression:               - Non-bleeding internal hemorrhoids.                           - Diverticulosis in the sigmoid colon and in the                            descending colon.                           - Melanosis in the colon.                           - One less than 1 mm polyp at the ileocecal valve,                            removed with a cold biopsy forceps. Resected and                            retrieved.                           - One 2 mm polyp at the appendiceal orifice,                            removed with a cold snare. Resected and retrieved.                           - Multiple erosions in the distal ileum. This may                            be due to NSAIDs. Biopsied.                           - The examination was otherwise normal on direct                            and retroflexion views. Recommendation:           - Patient has a contact number available for                            emergencies. The signs and symptoms of potential                            delayed complications were discussed with the                            patient. Return to normal activities tomorrow.  Written discharge instructions were provided to the                            patient.                           - Continue present medications.                           -  Avoid NSAIDs including Alka-Seltzer.                           - Await pathology results.                           - Repeat colonoscopy date to be determined after                            pending pathology results are reviewed for                            surveillance.                           - Follow a high fiber diet. Drink at least 64                            ounces of water daily. Add a daily stool bulking                            agent such as psyllium (an exampled would be                            Metamucil).                           - Emerging evidence supports eating a diet of                            fruits, vegetables, grains, calcium, and yogurt                            while reducing red meat and alcohol may reduce the                            risk of colon cancer.                           - Thank you for allowing me to be involved in your                            colon cancer prevention. Thornton Park MD, MD 07/20/2021 9:49:22 AM This report has been signed electronically.

## 2021-07-22 ENCOUNTER — Telehealth: Payer: Self-pay | Admitting: *Deleted

## 2021-07-22 ENCOUNTER — Telehealth: Payer: Self-pay

## 2021-07-22 NOTE — Telephone Encounter (Signed)
°  Follow up Call-  Call back number 07/20/2021  Post procedure Call Back phone  # [phone] cell  Permission to leave phone message Yes  Some recent data might be hidden     Patient questions:  Do you have a fever, pain , or abdominal swelling? No. Pain Score  0 *  Have you tolerated food without any problems? Yes.    Have you been able to return to your normal activities? Yes.    Do you have any questions about your discharge instructions: Diet   No. Medications  No. Follow up visit  No.  Do you have questions or concerns about your Care? No.  Actions: * If pain score is 4 or above: No action needed, pain <4.  Have you developed a fever since your procedure? no  2.   Have you had an respiratory symptoms (SOB or cough) since your procedure? no  3.   Have you tested positive for COVID 19 since your procedure no  4.   Have you had any family members/close contacts diagnosed with the COVID 19 since your procedure?  no   If yes to any of these questions please route to Joylene John, RN and Joella Prince, RN

## 2021-07-22 NOTE — Telephone Encounter (Signed)
First follow up call attempt.  LVM. 

## 2021-07-28 ENCOUNTER — Encounter: Payer: Self-pay | Admitting: Gastroenterology

## 2021-08-02 ENCOUNTER — Other Ambulatory Visit: Payer: Self-pay | Admitting: Diagnostic Neuroimaging

## 2021-08-02 ENCOUNTER — Encounter: Payer: Self-pay | Admitting: Emergency Medicine

## 2021-08-02 DIAGNOSIS — R569 Unspecified convulsions: Secondary | ICD-10-CM

## 2021-08-04 ENCOUNTER — Telehealth: Payer: Self-pay
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
## 2021-08-04 NOTE — Patient Instructions (Addendum)
Below is our plan:  We will switch you to Lamotrigine 200 mg 24 hour tablet. We have discussed the importance of medication compliance. Take 1 tablet every day at the same time every day for consistent dosing. We have ordered a home sleep study for you. You should hear back from our sleep center to schedule.   Please make sure you are staying well hydrated. I recommend 50-60 ounces daily. Well balanced diet and regular exercise encouraged. Consistent sleep schedule with 6-8 hours recommended.   Please continue follow up with care team as directed.   Follow up with me in 6 months or sooner if needed. Emjoy your trip to [place]!  You may receive a survey regarding today's visit. I encourage you to leave honest feed back as I do use this information to improve patient care. Thank you for seeing me today!

## 2021-08-04 NOTE — Telephone Encounter (Signed)
Emailed DME [facility] to see if pt was started on CPAP.   Per Marchelle Gearing, "It looks like she was a no show to her appointment. Order was voided. If we need it put it back in then we will need new chart notes and a RX".

## 2021-08-05 ENCOUNTER — Encounter: Payer: Self-pay | Admitting: Family Medicine

## 2021-08-05 ENCOUNTER — Ambulatory Visit: Payer: 59 | Admitting: Family Medicine

## 2021-08-05 ENCOUNTER — Telehealth: Payer: Self-pay | Admitting: Neurology

## 2021-08-05 ENCOUNTER — Telehealth: Payer: Self-pay

## 2021-08-05 VITALS — BP 134/84 | HR 62 | Ht 62.0 in | Wt 172.5 lb

## 2021-08-05 DIAGNOSIS — G4733 Obstructive sleep apnea (adult) (pediatric): Secondary | ICD-10-CM

## 2021-08-05 DIAGNOSIS — G40909 Epilepsy, unspecified, not intractable, without status epilepticus: Secondary | ICD-10-CM | POA: Diagnosis not present

## 2021-08-05 MED ORDER — LAMOTRIGINE ER 200 MG PO TB24: 200.0000 mg | ORAL_TABLET | Freq: Every day | ORAL | 1 refills | Status: DC

## 2021-08-05 NOTE — Telephone Encounter (Signed)
Pt calling in requesting a refill on: amcinonide (CYCLOCORT) 0.1 % cream Pt is wanting an ointment form of this medication due to the cream drying her hands out.   Pharmacy: [place], [place] - [id number]  LOV: 04/07/21  Pt CB [phone]

## 2021-08-05 NOTE — Telephone Encounter (Signed)
Completed PA for the pt on CMM/optum Faxed OV noted to CMM KEY: [id number] Will await determination

## 2021-08-06 LAB — COMPREHENSIVE METABOLIC PANEL
ALT: 14 IU/L (ref 0–32)
AST: 26 IU/L (ref 0–40)
Albumin/Globulin Ratio: 1.9 (ref 1.2–2.2)
Albumin: 4.7 g/dL (ref 3.8–4.9)
Alkaline Phosphatase: 64 IU/L (ref 44–121)
BUN/Creatinine Ratio: 14 (ref 12–28)
BUN: 13 mg/dL (ref 8–27)
Bilirubin Total: 0.3 mg/dL (ref 0.0–1.2)
CO2: 25 mmol/L (ref 20–29)
Calcium: 9.5 mg/dL (ref 8.7–10.3)
Chloride: 102 mmol/L (ref 96–106)
Creatinine, Ser: 0.92 mg/dL (ref 0.57–1.00)
Globulin, Total: 2.5 g/dL (ref 1.5–4.5)
Glucose: 90 mg/dL (ref 70–99)
Potassium: 4.5 mmol/L (ref 3.5–5.2)
Sodium: 141 mmol/L (ref 134–144)
Total Protein: 7.2 g/dL (ref 6.0–8.5)
eGFR: 71 mL/min/{1.73_m2} (ref >59)

## 2021-08-06 LAB — LAMOTRIGINE LEVEL: Lamotrigine Lvl: 5.2 ug/mL (ref 2.0–20.0)

## 2021-08-09 ENCOUNTER — Other Ambulatory Visit: Payer: Self-pay | Admitting: Emergency Medicine

## 2021-08-09 MED ORDER — AMCINONIDE 0.1 % EX OINT: TOPICAL_OINTMENT | Freq: Two times a day (BID) | CUTANEOUS | 1 refills | Status: DC

## 2021-08-09 NOTE — Telephone Encounter (Signed)
Request Reference Number: [id number]. LAMOTRIGINE TAB 200MG  ER is approved through 08/05/2022. Your patient may now fill this prescription and it will be covered.

## 2021-08-09 NOTE — Telephone Encounter (Signed)
New prescription in ointment form sent to pharmacy of record.  Thanks.

## 2021-08-10 ENCOUNTER — Telehealth: Payer: Self-pay

## 2021-08-10 NOTE — Telephone Encounter (Signed)
LVM for pt to call me back to schedule sleep study  

## 2021-08-24 ENCOUNTER — Telehealth: Payer: Self-pay

## 2021-08-24 NOTE — Telephone Encounter (Signed)
LVM for pt to call me back to schedule sleep study  

## 2021-09-02 ENCOUNTER — Telehealth: Payer: Self-pay

## 2021-09-02 NOTE — Telephone Encounter (Signed)
We have attempted to call the patient 2 times to schedule sleep study. Patient has been unavailable at the phone numbers we have on file and has not returned our calls. If patient calls back we will schedule them for their sleep study. ° °

## 2021-11-24 ENCOUNTER — Ambulatory Visit: Payer: 59 | Admitting: Emergency Medicine

## 2021-12-01 ENCOUNTER — Encounter: Payer: Self-pay | Admitting: Emergency Medicine

## 2021-12-01 ENCOUNTER — Ambulatory Visit: Payer: 59 | Admitting: Emergency Medicine

## 2021-12-01 VITALS — BP 136/78 | HR 75 | Temp 97.8°F | Ht 62.0 in | Wt 177.2 lb

## 2021-12-01 DIAGNOSIS — Z13228 Encounter for screening for other metabolic disorders: Secondary | ICD-10-CM

## 2021-12-01 DIAGNOSIS — Z1329 Encounter for screening for other suspected endocrine disorder: Secondary | ICD-10-CM

## 2021-12-01 DIAGNOSIS — Z1231 Encounter for screening mammogram for malignant neoplasm of breast: Secondary | ICD-10-CM

## 2021-12-01 DIAGNOSIS — Z13220 Encounter for screening for lipoid disorders: Secondary | ICD-10-CM

## 2021-12-01 DIAGNOSIS — Z130 Encounter for screening for diseases of the blood and blood-forming organs and certain disorders involving the immune mechanism: Secondary | ICD-10-CM

## 2021-12-01 DIAGNOSIS — Z0000 Encounter for general adult medical examination without abnormal findings: Secondary | ICD-10-CM

## 2021-12-01 DIAGNOSIS — G40909 Epilepsy, unspecified, not intractable, without status epilepticus: Secondary | ICD-10-CM

## 2021-12-01 LAB — CBC WITH DIFFERENTIAL/PLATELET
Basophils Absolute: 0 10*3/uL (ref 0.0–0.1)
Basophils Relative: 0.7 % (ref 0.0–3.0)
Eosinophils Absolute: 0.1 10*3/uL (ref 0.0–0.7)
Eosinophils Relative: 2.2 % (ref 0.0–5.0)
HCT: 38 % (ref 36.0–46.0)
Hemoglobin: 12.7 g/dL (ref 12.0–15.0)
Lymphocytes Relative: 20.2 % (ref 12.0–46.0)
Lymphs Abs: 1.3 10*3/uL (ref 0.7–4.0)
MCHC: 33.3 g/dL (ref 30.0–36.0)
MCV: 86.8 fl (ref 78.0–100.0)
Monocytes Absolute: 0.4 10*3/uL (ref 0.1–1.0)
Monocytes Relative: 5.6 % (ref 3.0–12.0)
Neutro Abs: 4.6 10*3/uL (ref 1.4–7.7)
Neutrophils Relative %: 71.3 % (ref 43.0–77.0)
Platelets: 274 10*3/uL (ref 150.0–400.0)
RBC: 4.38 Mil/uL (ref 3.87–5.11)
RDW: 13.4 % (ref 11.5–15.5)
WBC: 6.4 10*3/uL (ref 4.0–10.5)

## 2021-12-01 LAB — LIPID PANEL
Cholesterol: 237 mg/dL — ABNORMAL HIGH (ref 0–200)
HDL: 51.5 mg/dL (ref >39.00)
LDL Cholesterol: 152 mg/dL — ABNORMAL HIGH (ref 0–99)
NonHDL: 185.17
Total CHOL/HDL Ratio: 5
Triglycerides: 166 mg/dL — ABNORMAL HIGH (ref 0.0–149.0)
VLDL: 33.2 mg/dL (ref 0.0–40.0)

## 2021-12-01 LAB — VITAMIN B12: Vitamin B-12: 471 pg/mL (ref 211–911)

## 2021-12-01 LAB — COMPREHENSIVE METABOLIC PANEL
ALT: 14 U/L (ref 0–35)
AST: 23 U/L (ref 0–37)
Albumin: 4.4 g/dL (ref 3.5–5.2)
Alkaline Phosphatase: 48 U/L (ref 39–117)
BUN: 13 mg/dL (ref 6–23)
CO2: 28 mEq/L (ref 19–32)
Calcium: 9.7 mg/dL (ref 8.4–10.5)
Chloride: 103 mEq/L (ref 96–112)
Creatinine, Ser: 0.95 mg/dL (ref 0.40–1.20)
GFR: 64.81 mL/min (ref >60.00)
Glucose, Bld: 87 mg/dL (ref 70–99)
Potassium: 3.9 mEq/L (ref 3.5–5.1)
Sodium: 139 mEq/L (ref 135–145)
Total Bilirubin: 0.3 mg/dL (ref 0.2–1.2)
Total Protein: 7 g/dL (ref 6.0–8.3)

## 2021-12-01 LAB — VITAMIN D 25 HYDROXY (VIT D DEFICIENCY, FRACTURES): VITD: 13.61 ng/mL — ABNORMAL LOW (ref 30.00–100.00)

## 2021-12-01 LAB — HEMOGLOBIN A1C: Hgb A1c MFr Bld: 6.1 % (ref 4.6–6.5)

## 2021-12-01 NOTE — Progress Notes (Signed)
ARIEON SCALZO 61 y.o.   Chief Complaint  Patient presents with   concerns about weight     Pt is wanting to talk about [facility]    lab work     Pt wants labs done     HISTORY OF PRESENT ILLNESS: This is a 61 y.o. female here for follow-up and annual physical. Concerned about her weight.  Has questions about M[place]o Requesting blood work today. Seizure medication working well.  Has been seizure-free for a while. Wt Readings from Last 3 Encounters:  12/01/21 177 lb 4 oz (80.4 kg)  08/05/21 172 lb 8 oz (78.2 kg)  07/20/21 172 lb (78 kg)     HPI   Prior to Admission medications   Medication Sig Start Date End Date Taking? Authorizing Provider  amcinonide (CYCLOCORT) 0.1 % ointment Apply topically 2 (two) times daily. 08/09/21  Yes Horald Pollen, MD  erythromycin ophthalmic ointment SMARTSIG:Sparingly Right Eye 6 Times Daily 04/21/21  Yes [provider]  LamoTRIgine 200 MG TB24 24 hour tablet Take 1 tablet (200 mg total) by mouth daily. 08/05/21  Yes Lomax, Amy, NP  MURO 128 5 % ophthalmic solution Place 1 drop into the left eye 4 (four) times daily. 11/08/21  Yes [provider]  triamcinolone cream (KENALOG) 0.1 % Apply 1 application topically 2 (two) times daily.   Yes [provider]  metFORMIN (GLUCOPHAGE) 500 MG tablet Take 500 mg by mouth daily.  07/21/19  [provider]    No Known Allergies  Patient Active Problem List   Diagnosis Date Noted   Seizure disorder ([place]) 01/02/2020    Past Medical History:  Diagnosis Date   Bronchitis    COVID-19 virus infection 07/2019   OSA (obstructive sleep apnea)    no cpap- last sleep study told no OSA   Seizure ([place]) 06/2019   07-01-2021 last seizure 04-2020    Past Surgical History:  Procedure Laterality Date   ABDOMINAL HYSTERECTOMY     1987   BREAST REDUCTION SURGERY  2004   COLONOSCOPY     last colon > 10 yrs ago- last 2 colon's pt states had polyps- we have no records  , no path   POLYPECTOMY     REDUCTION MAMMAPLASTY Bilateral    tummy tuck  2008    Social History   Socioeconomic History   Marital status: Single    Spouse name: Not on file   Number of children: 2   Years of education: Not on file   Highest education level: Not on file  Occupational History    Comment: third shift  Tobacco Use   Smoking status: Never   Smokeless tobacco: Never  Vaping Use   Vaping Use: Never used  Substance and Sexual Activity   Alcohol use: Yes    Comment: 2 x week   Drug use: Never   Sexual activity: Not on file  Other Topics Concern   Not on file  Social History Narrative   Lives with mother.  Works for [employer].  Education college 2 yrs.  Divorced.  No children.  Caffeine 12-16 oz daily.   Social Determinants of Health   Financial Resource Strain: Not on file  Food Insecurity: Not on file  Transportation Needs: Not on file  Physical Activity: Not on file  Stress: Not on file  Social Connections: Not on file  Intimate Partner Violence: Not on file    Family History  Problem Relation Age of Onset  Hypertension Mother    Heart disease Mother    Hypertension Sister    Diabetes Brother    Hypertension Brother    Colon cancer Paternal Aunt    Heart disease Maternal Grandmother    Alzheimer's disease Other    Colon polyps Neg Hx    Esophageal cancer Neg Hx    Rectal cancer Neg Hx    Stomach cancer Neg Hx      Review of Systems  Constitutional: Negative.  Negative for chills and fever.  HENT: Negative.  Negative for congestion and sore throat.   Respiratory: Negative.  Negative for cough and shortness of breath.   Cardiovascular: Negative.  Negative for chest pain and palpitations.  Gastrointestinal: Negative.  Negative for abdominal pain, diarrhea, nausea and vomiting.  Genitourinary: Negative.  Negative for dysuria.  Skin: Negative.  Negative for rash.  Neurological:  Negative for dizziness and headaches.  All other systems  reviewed and are negative. Today's Vitals   12/01/21 1048 12/01/21 1120  BP: 140/84 136/78  Pulse: 75   Temp: 97.8 F (36.6 C)   TempSrc: Oral   SpO2: 96%   Weight: 177 lb 4 oz (80.4 kg)   Height: '5\' 2"'$  (1.575 m)    Body mass index is 32.42 kg/m.   Physical Exam Vitals reviewed.  Constitutional:      Appearance: Normal appearance.  HENT:     Head: Normocephalic.     Mouth/Throat:     Mouth: Mucous membranes are moist.     Pharynx: Oropharynx is clear.  Eyes:     Extraocular Movements: Extraocular movements intact.     Conjunctiva/sclera: Conjunctivae normal.     Pupils: Pupils are equal, round, and reactive to light.  Cardiovascular:     Rate and Rhythm: Normal rate and regular rhythm.     Pulses: Normal pulses.     Heart sounds: Normal heart sounds.  Pulmonary:     Effort: Pulmonary effort is normal.     Breath sounds: Normal breath sounds.  Abdominal:     Palpations: Abdomen is soft.     Tenderness: There is no abdominal tenderness.  Musculoskeletal:        General: Normal range of motion.     Cervical back: No tenderness.     Right lower leg: No edema.     Left lower leg: No edema.  Lymphadenopathy:     Cervical: No cervical adenopathy.  Skin:    General: Skin is warm and dry.     Capillary Refill: Capillary refill takes less than 2 seconds.  Neurological:     General: No focal deficit present.     Mental Status: She is alert and oriented to person, place, and time.  Psychiatric:        Mood and Affect: Mood normal.        Behavior: Behavior normal.     ASSESSMENT & PLAN: Problem List Items Addressed This Visit       Nervous and Auditory   Seizure disorder [facility])   Other Visit Diagnoses     Routine general medical examination at a health care facility    -  Primary   Visit for screening mammogram       Relevant Orders   Mammogram Digital Screening   Screening for deficiency anemia       Relevant Orders   CBC with Differential/Platelet    Screening for lipoid disorders       Relevant Orders   Lipid panel  Screening for endocrine, metabolic and immunity disorder       Relevant Orders   Comprehensive metabolic panel   Hemoglobin A1c   Vitamin B12   VITAMIN D 25 Hydroxy (Vit-D Deficiency, Fractures)      Modifiable risk factors discussed with patient. Anticipatory guidance according to age provided. The following topics were also discussed: Social Determinants of Health Smoking.  Non-smoker Diet and nutrition and need to decrease amount of daily carbohydrate intake Benefits of exercise Cancer screening and need for breast cancer screening with mammogram Vaccinations recommendations Cardiovascular risk assessment and need for blood work Mental health including depression and anxiety Fall and accident prevention  Patient Instructions  Health Maintenance, Female Adopting a healthy lifestyle and getting preventive care are important in promoting health and wellness. Ask your health care provider about: The right schedule for you to have regular tests and exams. Things you can do on your own to prevent diseases and keep yourself healthy. What should I know about diet, weight, and exercise? Eat a healthy diet  Eat a diet that includes plenty of vegetables, fruits, low-fat dairy products, and lean protein. Do not eat a lot of foods that are high in solid fats, added sugars, or sodium. Maintain a healthy weight Body mass index (BMI) is used to identify weight problems. It estimates body fat based on height and weight. Your health care provider can help determine your BMI and help you achieve or maintain a healthy weight. Get regular exercise Get regular exercise. This is one of the most important things you can do for your health. Most adults should: Exercise for at least 150 minutes each week. The exercise should increase your heart rate and make you sweat (moderate-intensity exercise). Do strengthening exercises at  least twice a week. This is in addition to the moderate-intensity exercise. Spend less time sitting. Even light physical activity can be beneficial. Watch cholesterol and blood lipids Have your blood tested for lipids and cholesterol at 61 years of age, then have this test every 5 years. Have your cholesterol levels checked more often if: Your lipid or cholesterol levels are high. You are older than 61 years of age. You are at high risk for heart disease. What should I know about cancer screening? Depending on your health history and family history, you may need to have cancer screening at various ages. This may include screening for: Breast cancer. Cervical cancer. Colorectal cancer. Skin cancer. Lung cancer. What should I know about heart disease, diabetes, and high blood pressure? Blood pressure and heart disease High blood pressure causes heart disease and increases the risk of stroke. This is more likely to develop in people who have high blood pressure readings or are overweight. Have your blood pressure checked: Every 3-5 years if you are 52-50 years of age. Every year if you are 47 years old or older. Diabetes Have regular diabetes screenings. This checks your fasting blood sugar level. Have the screening done: Once every three years after age 37 if you are at a normal weight and have a low risk for diabetes. More often and at a younger age if you are overweight or have a high risk for diabetes. What should I know about preventing infection? Hepatitis B If you have a higher risk for hepatitis B, you should be screened for this virus. Talk with your health care provider to find out if you are at risk for hepatitis B infection. Hepatitis C Testing is recommended for: Everyone born from [birth year] through  [place] with known risk factors for hepatitis C. Sexually transmitted infections (STIs) Get screened for STIs, including gonorrhea and chlamydia, if: You are sexually active  and are younger than 61 years of age. You are older than 61 years of age and your health care provider tells you that you are at risk for this type of infection. Your sexual activity has changed since you were last screened, and you are at increased risk for chlamydia or gonorrhea. Ask your health care provider if you are at risk. Ask your health care provider about whether you are at high risk for HIV. Your health care provider may recommend a prescription medicine to help prevent HIV infection. If you choose to take medicine to prevent HIV, you should first get tested for HIV. You should then be tested every 3 months for as long as you are taking the medicine. Pregnancy If you are about to stop having your period (premenopausal) and you may become pregnant, seek counseling before you get pregnant. Take 400 to 800 micrograms (mcg) of folic acid every day if you become pregnant. Ask for birth control (contraception) if you want to prevent pregnancy. Osteoporosis and menopause Osteoporosis is a disease in which the bones lose minerals and strength with aging. This can result in bone fractures. If you are 67 years old or older, or if you are at risk for osteoporosis and fractures, ask your health care provider if you should: Be screened for bone loss. Take a calcium or vitamin D supplement to lower your risk of fractures. Be given hormone replacement therapy (HRT) to treat symptoms of menopause. Follow these instructions at home: Alcohol use Do not drink alcohol if: Your health care provider tells you not to drink. You are pregnant, may be pregnant, or are planning to become pregnant. If you drink alcohol: Limit how much you have to: 0-1 drink a day. Know how much alcohol is in your drink. In the [place] one drink equals one 12 oz bottle of beer (355 mL), one 5 oz glass of wine (148 mL), or one 1 oz glass of hard liquor (44 mL). Lifestyle Do not use any products that contain nicotine or tobacco.  These products include cigarettes, chewing tobacco, and vaping devices, such as e-cigarettes. If you need help quitting, ask your health care provider. Do not use street drugs. Do not share needles. Ask your health care provider for help if you need support or information about quitting drugs. General instructions Schedule regular health, dental, and eye exams. Stay current with your vaccines. Tell your health care provider if: You often feel depressed. You have ever been abused or do not feel safe at home. Summary Adopting a healthy lifestyle and getting preventive care are important in promoting health and wellness. Follow your health care provider's instructions about healthy diet, exercising, and getting tested or screened for diseases. Follow your health care provider's instructions on monitoring your cholesterol and blood pressure. This information is not intended to replace advice given to you by your health care provider. Make sure you discuss any questions you have with your health care provider. Document Revised: 11/16/2020 Document Reviewed: 11/16/2020 Elsevier Patient Education  [place], MD [facility]

## 2021-12-01 NOTE — Patient Instructions (Signed)

## 2021-12-02 ENCOUNTER — Other Ambulatory Visit: Payer: Self-pay | Admitting: Emergency Medicine

## 2021-12-02 DIAGNOSIS — Z1231 Encounter for screening mammogram for malignant neoplasm of breast: Secondary | ICD-10-CM

## 2021-12-06 ENCOUNTER — Other Ambulatory Visit: Payer: Self-pay | Admitting: Emergency Medicine

## 2021-12-06 DIAGNOSIS — E785 Hyperlipidemia, unspecified: Secondary | ICD-10-CM

## 2021-12-06 DIAGNOSIS — E559 Vitamin D deficiency, unspecified: Secondary | ICD-10-CM

## 2021-12-06 MED ORDER — ROSUVASTATIN CALCIUM 10 MG PO TABS
10.0000 mg | ORAL_TABLET | Freq: Every day | ORAL | 3 refills | Status: DC
Start: 2021-12-06 — End: 2024-02-14

## 2021-12-06 MED ORDER — VITAMIN D (ERGOCALCIFEROL) 1.25 MG (50000 UNIT) PO CAPS: 50000.0000 [IU] | ORAL_CAPSULE | ORAL | 0 refills | Status: DC

## 2021-12-15 DIAGNOSIS — Z1231 Encounter for screening mammogram for malignant neoplasm of breast: Secondary | ICD-10-CM

## 2021-12-21 ENCOUNTER — Other Ambulatory Visit: Payer: Self-pay | Admitting: Emergency Medicine

## 2021-12-21 DIAGNOSIS — Z1231 Encounter for screening mammogram for malignant neoplasm of breast: Secondary | ICD-10-CM

## 2022-02-03 ENCOUNTER — Ambulatory Visit: Payer: 59 | Admitting: Family Medicine

## 2022-03-08 ENCOUNTER — Encounter: Payer: 59 | Admitting: Psychology

## 2022-03-09 ENCOUNTER — Encounter: Payer: 59 | Admitting: Psychology

## 2022-03-17 ENCOUNTER — Encounter: Payer: 59 | Admitting: Psychology

## 2022-03-29 ENCOUNTER — Ambulatory Visit: Payer: 59 | Admitting: Emergency Medicine

## 2022-03-29 ENCOUNTER — Encounter: Payer: Self-pay | Admitting: Emergency Medicine

## 2022-03-29 VITALS — BP 136/88 | HR 72 | Temp 98.1°F | Ht 62.0 in | Wt 174.1 lb

## 2022-03-29 DIAGNOSIS — Z9889 Other specified postprocedural states: Secondary | ICD-10-CM | POA: Diagnosis not present

## 2022-03-29 DIAGNOSIS — E785 Hyperlipidemia, unspecified: Secondary | ICD-10-CM | POA: Insufficient documentation

## 2022-03-29 DIAGNOSIS — G40909 Epilepsy, unspecified, not intractable, without status epilepticus: Secondary | ICD-10-CM

## 2022-03-29 DIAGNOSIS — L659 Nonscarring hair loss, unspecified: Secondary | ICD-10-CM | POA: Diagnosis not present

## 2022-03-29 MED ORDER — TRIAMCINOLONE ACETONIDE 0.1 % EX CREA: 1.0000 | TOPICAL_CREAM | Freq: Two times a day (BID) | CUTANEOUS | 1 refills | Status: DC

## 2022-03-29 NOTE — Assessment & Plan Note (Signed)
Stable.  Diet and nutrition discussed. Cardiovascular risks associated with dyslipidemia discussed. Continue rosuvastatin 10 mg daily. The 10-year ASCVD risk score (Arnett DK, et al., 2019) is: 8%   Values used to calculate the score:     Age: 61 years     Sex: Female     Is Non-Hispanic African American: Yes     Diabetic: No     Tobacco smoker: No     Systolic Blood Pressure: 403 mmHg     Is BP treated: No     HDL Cholesterol: 51.5 mg/dL     Total Cholesterol: 237 mg/dL

## 2022-03-29 NOTE — Progress Notes (Signed)
Jane Heath 61 y.o.   Chief Complaint  Patient presents with   concerns about breast tissue lost due to surgery patient had   Alopecia    Patient has concerns    Medication Management    Patient is taking cholesterol medication and she do not want to take it     HISTORY OF PRESENT ILLNESS: This is a 61 y.o. female needing a letter regarding chronic alopecia and breast size difference due to tissue loss from breast reduction surgery done in [place] in 2008. This is for insurance purposes to help cover costs of wigs and custom made bras. Has history of dyslipidemia on rosuvastatin 10 mg daily.  Has questions about it. No other complaints or medical concerns.  HPI   Prior to Admission medications   Medication Sig Start Date End Date Taking? Authorizing Provider  amcinonide (CYCLOCORT) 0.1 % ointment Apply topically 2 (two) times daily. 08/09/21  Yes Horald Pollen, MD  erythromycin ophthalmic ointment SMARTSIG:Sparingly Right Eye 6 Times Daily 04/21/21  Yes [provider]  LamoTRIgine 200 MG TB24 24 hour tablet Take 1 tablet (200 mg total) by mouth daily. 08/05/21  Yes Lomax, Amy, NP  MURO 128 5 % ophthalmic solution Place 1 drop into the left eye 4 (four) times daily. 11/08/21  Yes [provider]  triamcinolone cream (KENALOG) 0.1 % Apply 1 application topically 2 (two) times daily.   Yes [provider]  Vitamin D, Ergocalciferol, (DRISDOL) 1.25 MG (50000 UNIT) CAPS capsule Take 1 capsule (50,000 Units total) by mouth every 7 (seven) days. 12/06/21  Yes Alanta Scobey, Ines Bloomer, MD  rosuvastatin (CRESTOR) 10 MG tablet Take 1 tablet (10 mg total) by mouth daily. Patient not taking: Reported on 03/29/2022 12/06/21   Horald Pollen, MD  metFORMIN (GLUCOPHAGE) 500 MG tablet Take 500 mg by mouth daily.  07/21/19  [provider]    No Known Allergies  Patient Active Problem List   Diagnosis Date Noted   Seizure disorder ([place]) 01/02/2020     Past Medical History:  Diagnosis Date   Bronchitis    COVID-19 virus infection 07/2019   OSA (obstructive sleep apnea)    no cpap- last sleep study told no OSA   Seizure ([place]) 06/2019   07-01-2021 last seizure 04-2020    Past Surgical History:  Procedure Laterality Date   ABDOMINAL HYSTERECTOMY     1987   BREAST REDUCTION SURGERY  2004   COLONOSCOPY     last colon > 10 yrs ago- last 2 colon's pt states had polyps- we have no records , no path   POLYPECTOMY     REDUCTION MAMMAPLASTY Bilateral    tummy tuck  2008    Social History   Socioeconomic History   Marital status: Single    Spouse name: Not on file   Number of children: 2   Years of education: Not on file   Highest education level: Not on file  Occupational History    Comment: third shift  Tobacco Use   Smoking status: Never   Smokeless tobacco: Never  Vaping Use   Vaping Use: Never used  Substance and Sexual Activity   Alcohol use: Yes    Comment: 2 x week   Drug use: Never   Sexual activity: Not on file  Other Topics Concern   Not on file  Social History Narrative   Lives with mother.  Works for [employer].  Education college 2 yrs.  Divorced.  No children.  Caffeine 12-16 oz daily.   Social Determinants of Health   Financial Resource Strain: Not on file  Food Insecurity: Not on file  Transportation Needs: Not on file  Physical Activity: Not on file  Stress: Not on file  Social Connections: Not on file  Intimate Partner Violence: Not on file    Family History  Problem Relation Age of Onset   Hypertension Mother    Heart disease Mother    Hypertension Sister    Diabetes Brother    Hypertension Brother    Colon cancer Paternal Aunt    Heart disease Maternal Grandmother    Alzheimer's disease Other    Colon polyps Neg Hx    Esophageal cancer Neg Hx    Rectal cancer Neg Hx    Stomach cancer Neg Hx      Review of Systems  Constitutional: Negative.  Negative for chills and fever.   HENT: Negative.  Negative for congestion and sore throat.   Respiratory: Negative.  Negative for cough and shortness of breath.   Cardiovascular: Negative.  Negative for chest pain and palpitations.  Gastrointestinal:  Negative for abdominal pain, diarrhea, nausea and vomiting.  Genitourinary: Negative.   Skin: Negative.  Negative for rash.  Neurological:  Negative for dizziness and headaches.  All other systems reviewed and are negative.  Today's Vitals   03/29/22 1401  BP: 136/88  Pulse: 72  Temp: 98.1 F (36.7 C)  TempSrc: Oral  SpO2: 98%  Weight: 174 lb 2 oz (79 kg)  Height: '5\' 2"'$  (1.575 m)   Body mass index is 31.85 kg/m.   Physical Exam Vitals reviewed.  Constitutional:      Appearance: Normal appearance.  HENT:     Head: Normocephalic.  Eyes:     Extraocular Movements: Extraocular movements intact.     Pupils: Pupils are equal, round, and reactive to light.  Cardiovascular:     Rate and Rhythm: Normal rate.  Pulmonary:     Effort: Pulmonary effort is normal.  Musculoskeletal:     Cervical back: No tenderness.  Lymphadenopathy:     Cervical: No cervical adenopathy.  Skin:    General: Skin is warm and dry.     Capillary Refill: Capillary refill takes less than 2 seconds.  Neurological:     General: No focal deficit present.     Mental Status: She is alert and oriented to person, place, and time.  Psychiatric:        Mood and Affect: Mood normal.        Behavior: Behavior normal.      ASSESSMENT & PLAN: A total of 42 minutes was spent with the patient and counseling/coordination of care regarding preparing for this visit, review of most recent office visit notes, review of multiple chronic medical problems and their management,, review of all medications, education on nutrition, prognosis, documentation, and need for follow-up.  Problem List Items Addressed This Visit       Nervous and Auditory   Seizure disorder [facility])    Remote history.  Presently  on treatment with Lamictal.        Musculoskeletal and Integument   Alopecia - Primary    Chronic problem but stable. Sees dermatologist on a regular basis. Chronic wig user        Other   History of bilateral breast reduction surgery    Stable.  No concerns. Needs custom made bras due to size discrepancy      Dyslipidemia    Stable.  Diet and nutrition discussed. Cardiovascular risks associated with dyslipidemia discussed. Continue rosuvastatin 10 mg daily. The 10-year ASCVD risk score (Arnett DK, et al., 2019) is: 8%   Values used to calculate the score:     Age: 57 years     Sex: Female     Is Non-Hispanic African American: Yes     Diabetic: No     Tobacco smoker: No     Systolic Blood Pressure: 923 mmHg     Is BP treated: No     HDL Cholesterol: 51.5 mg/dL     Total Cholesterol: 237 mg/dL       Patient Instructions  Health Maintenance, Female Adopting a healthy lifestyle and getting preventive care are important in promoting health and wellness. Ask your health care provider about: The right schedule for you to have regular tests and exams. Things you can do on your own to prevent diseases and keep yourself healthy. What should I know about diet, weight, and exercise? Eat a healthy diet  Eat a diet that includes plenty of vegetables, fruits, low-fat dairy products, and lean protein. Do not eat a lot of foods that are high in solid fats, added sugars, or sodium. Maintain a healthy weight Body mass index (BMI) is used to identify weight problems. It estimates body fat based on height and weight. Your health care provider can help determine your BMI and help you achieve or maintain a healthy weight. Get regular exercise Get regular exercise. This is one of the most important things you can do for your health. Most adults should: Exercise for at least 150 minutes each week. The exercise should increase your heart rate and make you sweat (moderate-intensity  exercise). Do strengthening exercises at least twice a week. This is in addition to the moderate-intensity exercise. Spend less time sitting. Even light physical activity can be beneficial. Watch cholesterol and blood lipids Have your blood tested for lipids and cholesterol at 61 years of age, then have this test every 5 years. Have your cholesterol levels checked more often if: Your lipid or cholesterol levels are high. You are older than 61 years of age. You are at high risk for heart disease. What should I know about cancer screening? Depending on your health history and family history, you may need to have cancer screening at various ages. This may include screening for: Breast cancer. Cervical cancer. Colorectal cancer. Skin cancer. Lung cancer. What should I know about heart disease, diabetes, and high blood pressure? Blood pressure and heart disease High blood pressure causes heart disease and increases the risk of stroke. This is more likely to develop in people who have high blood pressure readings or are overweight. Have your blood pressure checked: Every 3-5 years if you are 72-58 years of age. Every year if you are 85 years old or older. Diabetes Have regular diabetes screenings. This checks your fasting blood sugar level. Have the screening done: Once every three years after age 74 if you are at a normal weight and have a low risk for diabetes. More often and at a younger age if you are overweight or have a high risk for diabetes. What should I know about preventing infection? Hepatitis B If you have a higher risk for hepatitis B, you should be screened for this virus. Talk with your health care provider to find out if you are at risk for hepatitis B infection. Hepatitis C Testing is recommended for: Everyone born from [birth year] through 1965. Anyone with known  risk factors for hepatitis C. Sexually transmitted infections (STIs) Get screened for STIs, including gonorrhea and  chlamydia, if: You are sexually active and are younger than 61 years of age. You are older than 60 years of age and your health care provider tells you that you are at risk for this type of infection. Your sexual activity has changed since you were last screened, and you are at increased risk for chlamydia or gonorrhea. Ask your health care provider if you are at risk. Ask your health care provider about whether you are at high risk for HIV. Your health care provider may recommend a prescription medicine to help prevent HIV infection. If you choose to take medicine to prevent HIV, you should first get tested for HIV. You should then be tested every 3 months for as long as you are taking the medicine. Pregnancy If you are about to stop having your period (premenopausal) and you may become pregnant, seek counseling before you get pregnant. Take 400 to 800 micrograms (mcg) of folic acid every day if you become pregnant. Ask for birth control (contraception) if you want to prevent pregnancy. Osteoporosis and menopause Osteoporosis is a disease in which the bones lose minerals and strength with aging. This can result in bone fractures. If you are 3 years old or older, or if you are at risk for osteoporosis and fractures, ask your health care provider if you should: Be screened for bone loss. Take a calcium or vitamin D supplement to lower your risk of fractures. Be given hormone replacement therapy (HRT) to treat symptoms of menopause. Follow these instructions at home: Alcohol use Do not drink alcohol if: Your health care provider tells you not to drink. You are pregnant, may be pregnant, or are planning to become pregnant. If you drink alcohol: Limit how much you have to: 0-1 drink a day. Know how much alcohol is in your drink. In the [place] one drink equals one 12 oz bottle of beer (355 mL), one 5 oz glass of wine (148 mL), or one 1 oz glass of hard liquor (44 mL). Lifestyle Do not use any  products that contain nicotine or tobacco. These products include cigarettes, chewing tobacco, and vaping devices, such as e-cigarettes. If you need help quitting, ask your health care provider. Do not use street drugs. Do not share needles. Ask your health care provider for help if you need support or information about quitting drugs. General instructions Schedule regular health, dental, and eye exams. Stay current with your vaccines. Tell your health care provider if: You often feel depressed. You have ever been abused or do not feel safe at home. Summary Adopting a healthy lifestyle and getting preventive care are important in promoting health and wellness. Follow your health care provider's instructions about healthy diet, exercising, and getting tested or screened for diseases. Follow your health care provider's instructions on monitoring your cholesterol and blood pressure. This information is not intended to replace advice given to you by your health care provider. Make sure you discuss any questions you have with your health care provider. Document Revised: 11/16/2020 Document Reviewed: 11/16/2020 Elsevier Patient Education  [place], MD [facility]

## 2022-03-29 NOTE — Assessment & Plan Note (Signed)
Chronic problem but stable. Sees dermatologist on a regular basis. Chronic wig user

## 2022-03-29 NOTE — Assessment & Plan Note (Addendum)
Stable.  No concerns. Needs custom made bras due to size discrepancy

## 2022-03-29 NOTE — Assessment & Plan Note (Signed)
Remote history.  Presently on treatment with Lamictal.

## 2022-03-29 NOTE — Patient Instructions (Signed)

## 2022-03-30 ENCOUNTER — Encounter: Payer: Self-pay | Admitting: Family Medicine

## 2022-03-30 ENCOUNTER — Encounter: Payer: 59 | Attending: Psychology | Admitting: Psychology

## 2022-03-30 DIAGNOSIS — G3184 Mild cognitive impairment, so stated: Secondary | ICD-10-CM | POA: Insufficient documentation

## 2022-03-30 DIAGNOSIS — G40909 Epilepsy, unspecified, not intractable, without status epilepticus: Secondary | ICD-10-CM | POA: Insufficient documentation

## 2022-03-30 DIAGNOSIS — R413 Other amnesia: Secondary | ICD-10-CM | POA: Insufficient documentation

## 2022-03-31 ENCOUNTER — Encounter: Payer: Self-pay | Admitting: Psychology
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
## 2022-04-06 NOTE — Telephone Encounter (Signed)
MAIL OUT HST- [employer] no auth req.  Mailed out on 04/07/22.

## 2022-04-07 ENCOUNTER — Ambulatory Visit: Payer: 59 | Admitting: Neurology

## 2022-04-07 DIAGNOSIS — G4733 Obstructive sleep apnea (adult) (pediatric): Secondary | ICD-10-CM | POA: Diagnosis not present

## 2022-04-27 NOTE — Progress Notes (Signed)
See procedure note.

## 2022-04-27 NOTE — Procedures (Signed)
   [facility]  HOME SLEEP TEST (Watch PAT) REPORT (mail out unit)  STUDY DATE: 04/26/2022  DOB: 04-21-1961  MRN: [id number]  ORDERING CLINICIAN: Star Age, MD, PhD   REFERRING CLINICIAN: Debbora Presto, NP  CLINICAL INFORMATION/HISTORY: 61 year old female with a history of seizure disorder and obesity, who was previously diagnosed with sleep apnea.  She presents for re-evaluation.  BMI: 31.6 kg/m  FINDINGS:   Sleep Summary:   Total Recording Time (hours, min): 7 hours, 30 min  Total Sleep Time (hours, min):  6 hours, 34 min  Percent REM (%):    30.3%   Respiratory Indices:   Calculated pAHI (per hour):  19.5/hour         REM pAHI:    36.2/hour       NREM pAHI: 14.6/hour  Central pAHI: 1.9/hour  Oxygen Saturation Statistics:    Oxygen Saturation (%) Mean: 92%   Minimum oxygen saturation (%):                 84%   O2 Saturation Range (%): 84- 100%    O2 Saturation (minutes) <=88%: 2.9 min  Pulse Rate Statistics:   Pulse Mean (bpm):    67/min    Pulse Range (53- 115/min)   IMPRESSION: OSA (obstructive sleep apnea)   RECOMMENDATION:  This home sleep test demonstrates moderate obstructive sleep apnea with a total AHI of 19.5/hour and O2 nadir of 84%.  Mild to moderate snoring was detected, at times in the louder range.  Treatment with a positive airway pressure (PAP) device is recommended. The patient will be advised to proceed with an autoPAP titration/trial at home for now (5-12 cm, mask of choice, sized to fit, EPR per patient preference). A full night titration study may be considered to optimize treatment settings, monitor proper oxygen saturations and aid with improvement of tolerance and adherence, if needed down the road. Alternative treatment options may include a dental device through dentistry or orthodontics in selected patients or Inspire (hypoglossal nerve stimulator) in carefully selected patients (meeting inclusion criteria).   Concomitant weight loss is recommended (where clinically appropriate). Please note that untreated obstructive sleep apnea may carry additional perioperative morbidity. Patients with significant obstructive sleep apnea should receive perioperative PAP therapy and the surgeons and particularly the anesthesiologist should be informed of the diagnosis and the severity of the sleep disordered breathing. The patient should be cautioned not to drive, work at heights, or operate dangerous or heavy equipment when tired or sleepy. Review and reiteration of good sleep hygiene measures should be pursued with any patient. Other causes of the patient's symptoms, including circadian rhythm disturbances, an underlying mood disorder, medication effect and/or an underlying medical problem cannot be ruled out based on this test. Clinical correlation is recommended.  The patient and her referring provider will be notified of the test results. The patient will be seen in follow up in sleep clinic at [facility].  I certify that I have reviewed the raw data recording prior to the issuance of this report in accordance with the standards of the American Academy of Sleep Medicine (AASM).  INTERPRETING PHYSICIAN:   Star Age, MD, PhD  Board Certified in Neurology and Sleep Medicine  [facility] [address] [place], [place] [address] [phone]

## 2022-04-28 ENCOUNTER — Other Ambulatory Visit: Payer: Self-pay | Admitting: Family Medicine

## 2022-04-28 DIAGNOSIS — G4733 Obstructive sleep apnea (adult) (pediatric): Secondary | ICD-10-CM

## 2022-05-02 ENCOUNTER — Telehealth: Payer: Self-pay

## 2022-05-17 ENCOUNTER — Encounter: Payer: 59 | Attending: Psychology

## 2022-05-17 DIAGNOSIS — R413 Other amnesia: Secondary | ICD-10-CM | POA: Insufficient documentation

## 2022-05-17 DIAGNOSIS — F0781 Postconcussional syndrome: Secondary | ICD-10-CM | POA: Insufficient documentation

## 2022-05-17 DIAGNOSIS — G3184 Mild cognitive impairment, so stated: Secondary | ICD-10-CM | POA: Diagnosis not present

## 2022-05-17 NOTE — Progress Notes (Unsigned)
Behavioral Observations  The patient appeared well-groomed and appropriately dressed. Her manners were polite and appropriate to the situation. She demonstrated a positive attitude toward testing and showed good effort.    Neuropsychology Note  Jane Heath completed 270 minutes of neuropsychological testing with technician, Dina Rich, BA, under the supervision of Ilean Skill, PsyD., Clinical Neuropsychologist. The patient did not appear overtly distressed by the testing session, per behavioral observation or via self-report to the technician. Rest breaks were offered.   Clinical Decision Making: In considering the patient's current level of functioning, level of presumed impairment, nature of symptoms, emotional and behavioral responses during clinical interview, level of literacy, and observed level of motivation/effort, a battery of tests was selected by Dr. Sima Matas during the follow-up appointment on 03/30/2022. This was communicated to the technician. Communication between the neuropsychologist and technician was ongoing throughout the testing session and changes were made as deemed necessary based on patient performance on testing, technician observations and additional pertinent factors such as those listed above.  Tests Administered: Controlled Oral Word Association Test (COWAT; FAS & Animals)  Finger Tapping Test (FTT) Grooved Pegboard Hand Dynamometer  [employer] Test (WCST) Wechsler Adult Intelligence Scale, 4th Edition (WAIS-IV) Wechsler Memory Scale, 4th Edition (WMS-IV); Adult Battery   Results:  COWAT FAS total = 27 Z = -1.24 Animals total = 16 Z = -0.52      FTT Right Hand '12 11 11 9 14 '$ Left Hand '14 14 12 14 15      '$ Grooved Pegboard Right Hand Time = 2:14.90 Drops = 3 Left Hand Time = 2:58.97 Drops = 11      Hand Dynamometer Right Hand `23 24 Left Hand 17 18      WCST **Will be included in final  report   WAIS-IV  Composite Score Summary  Scale Sum of Scaled Scores Composite Score Percentile Rank 95% Conf. Interval Qualitative Description  Verbal Comprehension 20 VCI 81 10 76-87 Low Average  Perceptual Reasoning 14 PRI 69 2 64-77 Extremely Low  Working Memory 13 WMI 80 9 74-88 Low Average  Processing Speed 9 PSI 71 3 66-82 Borderline  Full Scale 56 FSIQ 71 3 68-76 Borderline  General Ability 34 GAI 73 4 69-79 Borderline       Verbal Comprehension Subtests Summary  Subtest Raw Score Scaled Score Percentile Rank Reference Group Scaled Score SEM  Similarities '22 8 25 9 '$ 1.08  Vocabulary '31 8 25 9 '$ 0.73  Information '5 4 2 5 '$ 0.67  (Comprehension) '17 7 16 7 '$ 1.08        Perceptual Reasoning Subtests Summary  Subtest Raw Score Scaled Score Percentile Rank Reference Group Scaled Score SEM  Block Design '11 4 2 3 '$ 1.04  Matrix Reasoning '9 6 9 4 '$ 0.95  Visual Puzzles '5 4 2 3 '$ 0.99  (Figure Weights) '6 5 5 5 '$ 0.99  (Picture Completion) '3 3 1 2 '$ 1.12        Working Doctor, general practice Raw Score Scaled Score Percentile Rank Reference Group Scaled Score SEM  Digit Span '23 8 25 7 '$ 0.85  Arithmetic '8 5 5 5 '$ 1.04  (Letter-Number Seq.) '15 7 16 6 '$ 1.08        Processing Speed Subtests Summary  Subtest Raw Score Scaled Score Percentile Rank Reference Group Scaled Score SEM  Symbol Search '15 5 5 4 '$ 1.31  Coding '30 4 2 3 '$ 0.99  (Cancellation) '15 3 1 2 '$ 1.34  WMS-IV  Index Score Summary  Index Sum of Scaled Scores Index Score Percentile Rank 95% Confidence Interval Qualitative Descriptor  Auditory Memory (AMI) 31 87 19 81-94 Low Average  Visual Memory (VMI) 34 90 25 85-96 Average  Visual Working Memory (VWMI) 11 73 4 68-82 Borderline  Immediate Memory (IMI) 34 89 23 83-96 Low Average  Delayed Memory (DMI) 31 84 14 78-92 Low Average       Primary Subtest Scaled Score Summary  Subtest Domain Raw Score Scaled Score Percentile Rank  Logical Memory  I AM '18 7 16  '$ Logical Memory II AM '12 6 9  '$ Verbal Paired Associates I AM '22 8 25  '$ Verbal Paired Associates II AM 9 10 50  Designs I VM 66 10 50  Designs II VM 47 9 37  Visual Reproduction I VM 31 9 37  Visual Reproduction II VM '9 6 9  '$ Spatial Addition VWM '3 4 2  '$ Symbol Span VWM '14 7 16       '$ Auditory Memory Process Score Summary  Process Score Raw Score Scaled Score Percentile Rank Cumulative Percentage (Base Rate)  LM II Recognition 17 - - <=2%  VPA II Recognition 38 - - 26-50%        Visual Memory Process Score Summary  Process Score Raw Score Scaled Score Percentile Rank Cumulative Percentage (Base Rate)  DE I Content 36 11 63 -  DE I Spatial '14 8 25 '$ -  DE II Content 36 12 75 -  DE II Spatial '9 8 25 '$ -  DE II Recognition 12 - - 17-25%  VR II Recognition 4 - - 17-25%       ABILITY-MEMORY ANALYSIS  Ability Score:  VCI: 81 Date of Testing:  WAIS-IV; WMS-IV 2022/05/17  Predicted Difference Method   Index Predicted WMS-IV Index Score Actual WMS-IV Index Score Difference Critical Value  Significant Difference Y/N Base Rate  Auditory Memory 90 87 3 9.00 N   Visual Memory 91 90 1 8.38 N   Visual Working Memory 90 73 17 10.86 Y 10%  Immediate Memory 89 89 0 10.12 N   Delayed Memory 90 84 6 9.95 N   Statistical significance (critical value) at the .01 level.        Feedback to Patient: Jane Heath will return on 06/16/2022 for an interactive feedback session with Dr. Sima Matas at which time her test performances, clinical impressions and treatment recommendations will be reviewed in detail. The patient understands she can contact our office should she require our assistance before this time.  270 minutes spent face-to-face with patient administering standardized tests, 30 minutes spent scoring Environmental education officer). [CPT [id number], [id number]]  Full report to follow.

## 2022-05-25 ENCOUNTER — Ambulatory Visit: Payer: 59 | Admitting: Emergency Medicine

## 2022-05-26 ENCOUNTER — Encounter: Payer: Self-pay | Admitting: Psychology

## 2022-05-26 ENCOUNTER — Encounter (HOSPITAL_BASED_OUTPATIENT_CLINIC_OR_DEPARTMENT_OTHER): Payer: 59 | Admitting: Psychology

## 2022-05-26 DIAGNOSIS — F0781 Postconcussional syndrome: Secondary | ICD-10-CM | POA: Diagnosis not present

## 2022-05-26 DIAGNOSIS — R413 Other amnesia: Secondary | ICD-10-CM

## 2022-05-26 DIAGNOSIS — G3184 Mild cognitive impairment, so stated: Secondary | ICD-10-CM

## 2022-05-26 NOTE — Progress Notes (Signed)
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 brain injury, possible development of seizure disorder and issues with sleep overall.  The patient has been diagnosed with an obstructive sleep apnea previously and there have been ongoing issues to have a new home sleep study conducted and the patient has had a CPAP device ordered as of 04/28/2022.  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   I will sit down with the patient go over the results of the current neuropsychological evaluation and specific recommendations going forward.  I do think that addressing her obstructive sleep apnea may help with some of the cognitive symptoms as well as depression/anxiety type symptoms as well.  The patient is not currently taking  any psychotropic medications and it may be worthwhile trying an SSRI if the patient was agreeable to such a trial.  Diagnosis:    Postconcussion syndrome  Mild cognitive impairment  Memory loss   _____________________ Ilean Skill, Psy.D. Clinical Neuropsychologist

## 2022-05-30 ENCOUNTER — Encounter: Payer: Self-pay | Admitting: Emergency Medicine

## 2022-05-30 ENCOUNTER — Ambulatory Visit: Payer: 59 | Admitting: Emergency Medicine

## 2022-05-30 VITALS — BP 162/88 | Temp 98.1°F | Ht 62.0 in | Wt 170.1 lb

## 2022-05-30 DIAGNOSIS — G40909 Epilepsy, unspecified, not intractable, without status epilepticus: Secondary | ICD-10-CM

## 2022-05-30 DIAGNOSIS — R7303 Prediabetes: Secondary | ICD-10-CM | POA: Diagnosis not present

## 2022-05-30 DIAGNOSIS — R030 Elevated blood-pressure reading, without diagnosis of hypertension: Secondary | ICD-10-CM | POA: Insufficient documentation

## 2022-05-30 DIAGNOSIS — E785 Hyperlipidemia, unspecified: Secondary | ICD-10-CM

## 2022-05-30 LAB — POCT GLYCOSYLATED HEMOGLOBIN (HGB A1C): Hemoglobin A1C: 5.5 % (ref 4.0–5.6)

## 2022-05-30 NOTE — Assessment & Plan Note (Signed)
Elevated blood pressure readings in the office but normal readings at home. Advised to continue monitoring blood pressure readings at home daily and keep a log.  Advised to contact the office if numbers persistently abnormal.

## 2022-05-30 NOTE — Assessment & Plan Note (Signed)
Stable.  Diet and nutrition discussed.  Continue rosuvastatin 10 mg daily. The 10-year ASCVD risk score (Arnett DK, et al., 2019) is: 12.8%   Values used to calculate the score:     Age: 61 years     Sex: Female     Is Non-Hispanic African American: Yes     Diabetic: No     Tobacco smoker: No     Systolic Blood Pressure: 332 mmHg     Is BP treated: No     HDL Cholesterol: 51.5 mg/dL     Total Cholesterol: 237 mg/dL

## 2022-05-30 NOTE — Patient Instructions (Signed)

## 2022-05-30 NOTE — Progress Notes (Signed)
Jane Heath 61 y.o.   Chief complaint: 46-monthfollow-up  HISTORY OF PRESENT ILLNESS: This is a 61y.o. female here for 373-monthollow-up of chronic medical problems. Overall doing well.  Has no complaints or medical concerns today.  HPI   Prior to Admission medications   Medication Sig Start Date End Date Taking? Authorizing Provider  amcinonide (CYCLOCORT) 0.1 % ointment Apply topically 2 (two) times daily. 08/09/21  Yes SaHorald PollenMD  erythromycin ophthalmic ointment SMARTSIG:Sparingly Right Eye 6 Times Daily 04/21/21  Yes [provider]  LamoTRIgine 200 MG TB24 24 hour tablet Take 1 tablet (200 mg total) by mouth daily. 08/05/21  Yes Lomax, Amy, NP  MURO 128 5 % ophthalmic solution Place 1 drop into the left eye 4 (four) times daily. 11/08/21  Yes [provider]  rosuvastatin (CRESTOR) 10 MG tablet Take 1 tablet (10 mg total) by mouth daily. 12/06/21  Yes Burnie Therien, MiInes BloomerMD  triamcinolone cream (KENALOG) 0.1 % Apply 1 Application topically 2 (two) times daily. 03/29/22  Yes Clevon Khader, MiInes BloomerMD  Vitamin D, Ergocalciferol, (DRISDOL) 1.25 MG (50000 UNIT) CAPS capsule Take 1 capsule (50,000 Units total) by mouth every 7 (seven) days. 12/06/21  Yes Breanna Mcdaniel, MiInes BloomerMD  metFORMIN (GLUCOPHAGE) 500 MG tablet Take 500 mg by mouth daily.  07/21/19  [provider]    No Known Allergies  Patient Active Problem List   Diagnosis Date Noted   Alopecia 03/29/2022   History of bilateral breast reduction surgery 03/29/2022   Dyslipidemia 03/29/2022   Seizure disorder ([place]/24/2021    Past Medical History:  Diagnosis Date   Bronchitis    COVID-19 virus infection 07/2019   OSA (obstructive sleep apnea)    no cpap- last sleep study told no OSA   Seizure ([place]12/2020   07-01-2021 last seizure 04-2020    Past Surgical History:  Procedure Laterality Date   ABDOMINAL HYSTERECTOMY     1987   BREAST REDUCTION SURGERY  2004    COLONOSCOPY     last colon > 10 yrs ago- last 2 colon's pt states had polyps- we have no records , no path   POLYPECTOMY     REDUCTION MAMMAPLASTY Bilateral    tummy tuck  2008    Social History   Socioeconomic History   Marital status: Single    Spouse name: Not on file   Number of children: 2   Years of education: Not on file   Highest education level: Not on file  Occupational History    Comment: third shift  Tobacco Use   Smoking status: Never   Smokeless tobacco: Never  Vaping Use   Vaping Use: Never used  Substance and Sexual Activity   Alcohol use: Yes    Comment: 2 x week   Drug use: Never   Sexual activity: Not on file  Other Topics Concern   Not on file  Social History Narrative   Lives with mother.  Works for [employer] 2 yrs.  Divorced.  No children.  Caffeine 12-16 oz daily.   Social Determinants of Health   Financial Resource Strain: Not on file  Food Insecurity: Not on file  Transportation Needs: Not on file  Physical Activity: Not on file  Stress: Not on file  Social Connections: Not on file  Intimate Partner Violence: Not on file    Family History  Problem Relation Age of Onset   Hypertension Mother    Heart  disease Mother    Hypertension Sister    Diabetes Brother    Hypertension Brother    Colon cancer Paternal Aunt    Heart disease Maternal Grandmother    Alzheimer's disease Other    Colon polyps Neg Hx    Esophageal cancer Neg Hx    Rectal cancer Neg Hx    Stomach cancer Neg Hx      Review of Systems  Constitutional: Negative.  Negative for chills and fever.  HENT: Negative.  Negative for congestion and sore throat.   Respiratory: Negative.  Negative for cough and shortness of breath.   Cardiovascular: Negative.  Negative for chest pain and palpitations.  Gastrointestinal: Negative.  Negative for abdominal pain, nausea and vomiting.  Genitourinary: Negative.   Skin: Negative.  Negative for rash.  Neurological:  Negative.  Negative for dizziness and headaches.  All other systems reviewed and are negative.  Today's Vitals   05/30/22 1330 05/30/22 1340  BP: (!) 160/82 (!) 162/88  Temp: 98.1 F (36.7 C)   TempSrc: Oral   Weight: 170 lb 2 oz (77.2 kg)   Height: '5\' 2"'$  (1.575 m)    Body mass index is 31.12 kg/m. Wt Readings from Last 3 Encounters:  05/30/22 170 lb 2 oz (77.2 kg)  03/29/22 174 lb 2 oz (79 kg)  12/01/21 177 lb 4 oz (80.4 kg)   BP Readings from Last 3 Encounters:  05/30/22 (!) 162/88  03/29/22 136/88  12/01/21 136/78     Physical Exam Vitals reviewed.  Constitutional:      Appearance: Normal appearance.  HENT:     Head: Normocephalic.     Mouth/Throat:     Mouth: Mucous membranes are moist.     Pharynx: Oropharynx is clear.  Eyes:     Extraocular Movements: Extraocular movements intact.     Conjunctiva/sclera: Conjunctivae normal.     Pupils: Pupils are equal, round, and reactive to light.  Cardiovascular:     Rate and Rhythm: Normal rate and regular rhythm.     Pulses: Normal pulses.     Heart sounds: Normal heart sounds.  Pulmonary:     Effort: Pulmonary effort is normal.     Breath sounds: Normal breath sounds.  Musculoskeletal:     Cervical back: No tenderness.  Lymphadenopathy:     Cervical: No cervical adenopathy.  Skin:    General: Skin is warm and dry.  Neurological:     General: No focal deficit present.     Mental Status: She is alert and oriented to person, place, and time.  Psychiatric:        Mood and Affect: Mood normal.        Behavior: Behavior normal.    Results for orders placed or performed in visit on 05/30/22 (from the past 24 hour(s))  POCT HgB A1C     Status: None   Collection Time: 05/30/22  1:43 PM  Result Value Ref Range   Hemoglobin A1C 5.5 4.0 - 5.6 %   HbA1c POC (<> result, manual entry)     HbA1c, POC (prediabetic range)     HbA1c, POC (controlled diabetic range)       ASSESSMENT & PLAN: A total of 46 minutes was  spent with the patient and counseling/coordination of care regarding preparing for this visit, review of most recent office visit notes, review of multiple chronic medical problems and their management, review of all medications, review of most recent blood work results including interpretation of today's hemoglobin  A1c, education on nutrition, prognosis, documentation, and need for follow-up.  Problem List Items Addressed This Visit       Nervous and Auditory   Seizure disorder ([place])    Stable.  Seizure-free for a while now.  Continues lamotrigine 200 mg daily and follows up with neurologist regularly        Other   Dyslipidemia - Primary    Stable.  Diet and nutrition discussed.  Continue rosuvastatin 10 mg daily. The 10-year ASCVD risk score (Arnett DK, et al., 2019) is: 12.8%   Values used to calculate the score:     Age: 62 years     Sex: Female     Is Non-Hispanic African American: Yes     Diabetic: No     Tobacco smoker: No     Systolic Blood Pressure: 865 mmHg     Is BP treated: No     HDL Cholesterol: 51.5 mg/dL     Total Cholesterol: 237 mg/dL       Relevant Orders   POCT HgB A1C (Completed)   Prediabetes    Stable.  Hemoglobin A1c of 5.5. Diet and nutrition discussed. Advised to decrease amount of daily carbohydrate intake and daily calories and increase amount of plant based protein in her diet      Elevated blood pressure reading without diagnosis of hypertension    Elevated blood pressure readings in the office but normal readings at home. Advised to continue monitoring blood pressure readings at home daily and keep a log.  Advised to contact the office if numbers persistently abnormal.      Patient Instructions  Health Maintenance, Female Adopting a healthy lifestyle and getting preventive care are important in promoting health and wellness. Ask your health care provider about: The right schedule for you to have regular tests and exams. Things you can do  on your own to prevent diseases and keep yourself healthy. What should I know about diet, weight, and exercise? Eat a healthy diet  Eat a diet that includes plenty of vegetables, fruits, low-fat dairy products, and lean protein. Do not eat a lot of foods that are high in solid fats, added sugars, or sodium. Maintain a healthy weight Body mass index (BMI) is used to identify weight problems. It estimates body fat based on height and weight. Your health care provider can help determine your BMI and help you achieve or maintain a healthy weight. Get regular exercise Get regular exercise. This is one of the most important things you can do for your health. Most adults should: Exercise for at least 150 minutes each week. The exercise should increase your heart rate and make you sweat (moderate-intensity exercise). Do strengthening exercises at least twice a week. This is in addition to the moderate-intensity exercise. Spend less time sitting. Even light physical activity can be beneficial. Watch cholesterol and blood lipids Have your blood tested for lipids and cholesterol at 61 years of age, then have this test every 5 years. Have your cholesterol levels checked more often if: Your lipid or cholesterol levels are high. You are older than 61 years of age. You are at high risk for heart disease. What should I know about cancer screening? Depending on your health history and family history, you may need to have cancer screening at various ages. This may include screening for: Breast cancer. Cervical cancer. Colorectal cancer. Skin cancer. Lung cancer. What should I know about heart disease, diabetes, and high blood pressure? Blood pressure and heart  disease High blood pressure causes heart disease and increases the risk of stroke. This is more likely to develop in people who have high blood pressure readings or are overweight. Have your blood pressure checked: Every 3-5 years if you are 8-79  years of age. Every year if you are 55 years old or older. Diabetes Have regular diabetes screenings. This checks your fasting blood sugar level. Have the screening done: Once every three years after age 33 if you are at a normal weight and have a low risk for diabetes. More often and at a younger age if you are overweight or have a high risk for diabetes. What should I know about preventing infection? Hepatitis B If you have a higher risk for hepatitis B, you should be screened for this virus. Talk with your health care provider to find out if you are at risk for hepatitis B infection. Hepatitis C Testing is recommended for: Everyone born from [birth year] through 1965. Anyone with known risk factors for hepatitis C. Sexually transmitted infections (STIs) Get screened for STIs, including gonorrhea and chlamydia, if: You are sexually active and are younger than 61 years of age. You are older than 61 years of age and your health care provider tells you that you are at risk for this type of infection. Your sexual activity has changed since you were last screened, and you are at increased risk for chlamydia or gonorrhea. Ask your health care provider if you are at risk. Ask your health care provider about whether you are at high risk for HIV. Your health care provider may recommend a prescription medicine to help prevent HIV infection. If you choose to take medicine to prevent HIV, you should first get tested for HIV. You should then be tested every 3 months for as long as you are taking the medicine. Pregnancy If you are about to stop having your period (premenopausal) and you may become pregnant, seek counseling before you get pregnant. Take 400 to 800 micrograms (mcg) of folic acid every day if you become pregnant. Ask for birth control (contraception) if you want to prevent pregnancy. Osteoporosis and menopause Osteoporosis is a disease in which the bones lose minerals and strength with aging. This  can result in bone fractures. If you are 13 years old or older, or if you are at risk for osteoporosis and fractures, ask your health care provider if you should: Be screened for bone loss. Take a calcium or vitamin D supplement to lower your risk of fractures. Be given hormone replacement therapy (HRT) to treat symptoms of menopause. Follow these instructions at home: Alcohol use Do not drink alcohol if: Your health care provider tells you not to drink. You are pregnant, may be pregnant, or are planning to become pregnant. If you drink alcohol: Limit how much you have to: 0-1 drink a day. Know how much alcohol is in your drink. In the [place] one drink equals one 12 oz bottle of beer (355 mL), one 5 oz glass of wine (148 mL), or one 1 oz glass of hard liquor (44 mL). Lifestyle Do not use any products that contain nicotine or tobacco. These products include cigarettes, chewing tobacco, and vaping devices, such as e-cigarettes. If you need help quitting, ask your health care provider. Do not use street drugs. Do not share needles. Ask your health care provider for help if you need support or information about quitting drugs. General instructions Schedule regular health, dental, and eye exams. Stay current with your  vaccines. Tell your health care provider if: You often feel depressed. You have ever been abused or do not feel safe at home. Summary Adopting a healthy lifestyle and getting preventive care are important in promoting health and wellness. Follow your health care provider's instructions about healthy diet, exercising, and getting tested or screened for diseases. Follow your health care provider's instructions on monitoring your cholesterol and blood pressure. This information is not intended to replace advice given to you by your health care provider. Make sure you discuss any questions you have with your health care provider. Document Revised: 11/16/2020 Document Reviewed:  11/16/2020 Elsevier Patient Education  [place], MD [facility]

## 2022-05-30 NOTE — Assessment & Plan Note (Signed)
Stable.  Seizure-free for a while now.  Continues lamotrigine 200 mg daily and follows up with neurologist regularly

## 2022-05-30 NOTE — Assessment & Plan Note (Signed)
Stable.  Hemoglobin A1c of 5.5. Diet and nutrition discussed. Advised to decrease amount of daily carbohydrate intake and daily calories and increase amount of plant based protein in her diet

## 2022-06-16 ENCOUNTER — Encounter: Payer: 59 | Attending: Psychology | Admitting: Psychology

## 2022-06-16 DIAGNOSIS — G40909 Epilepsy, unspecified, not intractable, without status epilepticus: Secondary | ICD-10-CM | POA: Insufficient documentation

## 2022-06-16 DIAGNOSIS — G3184 Mild cognitive impairment, so stated: Secondary | ICD-10-CM | POA: Insufficient documentation

## 2022-06-16 DIAGNOSIS — F0781 Postconcussional syndrome: Secondary | ICD-10-CM | POA: Diagnosis present

## 2022-06-16 DIAGNOSIS — R413 Other amnesia: Secondary | ICD-10-CM | POA: Diagnosis present
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
## 2022-07-07 ENCOUNTER — Telehealth: Payer: Self-pay | Admitting: Family Medicine

## 2022-07-07 NOTE — Telephone Encounter (Signed)
Noted, please try again next week

## 2022-07-07 NOTE — Telephone Encounter (Signed)
Called and LVM for pt to call back and schedule her Initial cpap.

## 2022-07-12 NOTE — Telephone Encounter (Signed)
I left a VM for the patient (as per DPR). I advised her to contact [employer], her DME company regarding issues with the machine. A call back number was provided for any further questions or concerns.

## 2022-07-12 NOTE — Telephone Encounter (Signed)
Called pt to schedule appt and she informed me that she has been having difficulty using her machine because she will use if for like an hour but then will have to take it off due to having difficulty breathing. Pt is thinking that it could be the pressure. Please advise.

## 2022-07-13 NOTE — Telephone Encounter (Signed)
LVM for pt to call to schedule Initial CPAPappt.

## 2022-07-29 ENCOUNTER — Encounter: Payer: Self-pay | Admitting: Family Medicine

## 2022-07-29 ENCOUNTER — Other Ambulatory Visit: Payer: Self-pay | Admitting: Emergency Medicine

## 2022-07-29 MED ORDER — LAMOTRIGINE ER 200 MG PO TB24: 200.0000 mg | ORAL_TABLET | Freq: Every day | ORAL | 0 refills | Status: DC

## 2022-07-29 NOTE — Telephone Encounter (Signed)
Patient called requesting a refill on her medication  LamoTRIgine 200 MG TB24 24 hour tablet. She understands that Dr. Mitchel Honour didn't prescribed it but she was told to call her PCP to refill. Patient would like refill to be sent to pharmacy on file [place], [address]. Best callback number is [phone].

## 2022-07-29 NOTE — Telephone Encounter (Signed)
Error

## 2022-07-29 NOTE — Telephone Encounter (Signed)
Please refill per pt request, pt PCP is out of office

## 2022-08-03 NOTE — Telephone Encounter (Signed)
Called pt back. Informed her of message nurse [facility] sent. I scheduled pt appointment with Amy on March 11th @ 9:30am. Pt was also placed on waiting list for sooner appointment.Pt was very appreciate and thank me for call her.

## 2022-08-03 NOTE — Telephone Encounter (Signed)
Noted  

## 2022-08-03 NOTE — Telephone Encounter (Signed)
Please call pt back and schedule appt. It has been almost a yr since last visit. She has to come in for in office visit and bring machine/power cord with her to appt for [place] to review things/make recommendations on best next steps.

## 2022-08-03 NOTE — Telephone Encounter (Signed)
Pt is calling. Stated the CPAP pressure is too high and she needs it adjusted. Stated it is effecting her eyes and she had been going back and forth to eye doctor. Pt is requesting a call back.

## 2022-08-03 NOTE — Telephone Encounter (Signed)
Per [employer], patient has Luna machine/need to [employer] on [employer].

## 2022-08-03 NOTE — Telephone Encounter (Signed)
Tried pulling cpap data on resmed airview to review, unable. Sent community message to [employer] to get update
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
## 2022-09-19 ENCOUNTER — Ambulatory Visit: Payer: 59 | Admitting: Family Medicine

## 2022-09-19 ENCOUNTER — Telehealth: Payer: Self-pay | Admitting: Family Medicine

## 2022-09-19 DIAGNOSIS — G4733 Obstructive sleep apnea (adult) (pediatric): Secondary | ICD-10-CM

## 2022-09-19 DIAGNOSIS — G40909 Epilepsy, unspecified, not intractable, without status epilepticus: Secondary | ICD-10-CM
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
## 2022-10-30 ENCOUNTER — Other Ambulatory Visit: Payer: Self-pay | Admitting: Internal Medicine

## 2022-11-29 ENCOUNTER — Telehealth: Payer: Self-pay | Admitting: Emergency Medicine

## 2022-11-29 NOTE — Telephone Encounter (Signed)
Prescription Request  11/29/2022  LOV: 05/30/2022  What is the name of the medication or equipment? LamoTRIgine 200 MG TB24 24 hour tablet   Have you contacted your pharmacy to request a refill? No   Which pharmacy would you like this sent to?  [facility] on [address] in [place], [place]    Patient notified that their request is being sent to the clinical staff for review and that they should receive a response within 2 business days.   Please advise at Mobile [phone] (mobile)

## 2022-11-30 ENCOUNTER — Other Ambulatory Visit: Payer: Self-pay | Admitting: Emergency Medicine

## 2022-11-30 MED ORDER — LAMOTRIGINE ER 200 MG PO TB24: 200.0000 mg | ORAL_TABLET | Freq: Every day | ORAL | 0 refills | Status: DC

## 2022-11-30 NOTE — Telephone Encounter (Signed)
New prescription for lamotrigine sent to pharmacy of record today.  Thanks.

## 2022-12-21 ENCOUNTER — Ambulatory Visit
Admission: EM | Admit: 2022-12-21 | Discharge: 2022-12-21 | Disposition: A | Discharge status: Home / Self Care | Payer: 59 | Attending: Nurse Practitioner | Admitting: Nurse Practitioner

## 2022-12-21 DIAGNOSIS — R059 Cough, unspecified: Secondary | ICD-10-CM | POA: Diagnosis not present

## 2022-12-21 MED ORDER — GUAIFENESIN 100 MG/5ML PO LIQD: 100.0000 mg | ORAL | 0 refills | Status: DC | PRN

## 2022-12-21 MED ORDER — CETIRIZINE HCL 10 MG PO TABS: 10.0000 mg | ORAL_TABLET | Freq: Every day | ORAL | 0 refills | Status: DC

## 2022-12-21 MED ORDER — PREDNISONE 20 MG PO TABS: 40.0000 mg | ORAL_TABLET | Freq: Every day | ORAL | 0 refills | Status: AC

## 2022-12-21 NOTE — ED Triage Notes (Signed)
Pt reports she has chest congestion and it hurts to cough  x 3 days. Took theraflu and [employer]. Chest feels heavy
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
## 2022-12-21 NOTE — Discharge Instructions (Signed)
Take medication as prescribed. Increase fluids and allow for plenty of rest. May take over-the-counter Tylenol as needed for pain, fever, or general discomfort. Recommend using a humidifier in your bedroom at nighttime during sleep and sleeping elevated on pillows while cough symptoms persist. As discussed, if you develop worsening symptoms to include fever, chills, worsening wheezing, difficulty breathing, or shortness of breath, please follow-up in this clinic or with your primary care physician immediately. If you feel well, but continue to have a lingering cough, recommend the use of throat lozenges or cough drops along with increasing your fluid intake while symptoms persist. Follow-up as needed.

## 2022-12-22 ENCOUNTER — Ambulatory Visit: Payer: 59 | Admitting: Adult Health

## 2022-12-27 ENCOUNTER — Ambulatory Visit: Payer: 59 | Admitting: Emergency Medicine

## 2023-03-16 ENCOUNTER — Encounter: Payer: 59 | Admitting: Emergency Medicine

## 2023-03-22 ENCOUNTER — Encounter: Payer: 59 | Admitting: Emergency Medicine

## 2023-03-22 ENCOUNTER — Telehealth: Payer: Self-pay | Admitting: *Deleted

## 2023-03-22 ENCOUNTER — Encounter: Payer: Self-pay | Admitting: Emergency Medicine
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
## 2023-03-22 NOTE — Telephone Encounter (Signed)
Left detailed message on voicemail to bring cpap machine along with power cord to visit on 03/23/23

## 2023-03-23 ENCOUNTER — Ambulatory Visit: Payer: 59 | Admitting: Family Medicine

## 2023-03-23 ENCOUNTER — Encounter: Payer: Self-pay | Admitting: Family Medicine

## 2023-03-23 ENCOUNTER — Telehealth: Payer: Self-pay | Admitting: Family Medicine

## 2023-03-23 VITALS — BP 133/82 | HR 77 | Ht 62.0 in | Wt 178.5 lb

## 2023-03-23 DIAGNOSIS — G40909 Epilepsy, unspecified, not intractable, without status epilepticus: Secondary | ICD-10-CM

## 2023-03-23 DIAGNOSIS — G4733 Obstructive sleep apnea (adult) (pediatric): Secondary | ICD-10-CM | POA: Diagnosis not present

## 2023-03-23 MED ORDER — LAMOTRIGINE ER 200 MG PO TB24: 200.0000 mg | ORAL_TABLET | Freq: Every day | ORAL | 3 refills | Status: DC

## 2023-03-23 NOTE — Patient Instructions (Signed)
Below is our plan:  We will continue lamotrigine XR 200mg  daily. Continue labs with PCP. Discuss sleep apnea with your dentist. See if they can fit you for an oral appliance. If not, let me know. Once doing well with oral appliance we can repeat HST.   If you choose to resume CPAP, consider an eye mask at night.   Please make sure you are staying well hydrated. I recommend 50-60 ounces daily. Well balanced diet and regular exercise encouraged. Consistent sleep schedule with 6-8 hours recommended.   Please continue follow up with care team as directed.   Follow up with me in 1 year for seizure management, sooner pending oral appliance.   You may receive a survey regarding today's visit. I encourage you to leave honest feed back as I do use this information to improve patient care. Thank you for seeing me today!

## 2023-03-23 NOTE — Telephone Encounter (Signed)
REQUIRED PHONE NOTE: pt called to confirm appointment time

## 2023-10-16 ENCOUNTER — Ambulatory Visit
Admission: EM | Admit: 2023-10-16 | Discharge: 2023-10-16 | Disposition: A | Discharge status: Home / Self Care | Attending: Internal Medicine | Admitting: Internal Medicine

## 2023-10-16 DIAGNOSIS — J069 Acute upper respiratory infection, unspecified: Secondary | ICD-10-CM | POA: Insufficient documentation

## 2023-10-16 LAB — POC COVID19/FLU A&B COMBO
Covid Antigen, POC: NEGATIVE
Influenza A Antigen, POC: NEGATIVE
Influenza B Antigen, POC: NEGATIVE

## 2023-10-16 MED ORDER — LIDOCAINE VISCOUS HCL 2 % MT SOLN: 15.0000 mL | OROMUCOSAL | 0 refills | Status: DC | PRN

## 2023-10-16 MED ORDER — BENZONATATE 100 MG PO CAPS: 100.0000 mg | ORAL_CAPSULE | Freq: Three times a day (TID) | ORAL | 0 refills | Status: DC

## 2023-10-16 NOTE — ED Provider Notes (Signed)
 [facility]    CSN: [id number] Arrival date & time: 10/16/23  1801      History   Chief Complaint Chief Complaint  Patient presents with   Cough    HPI Jane Heath is a 63 y.o. female.   Jane Heath is a 63 y.o. female presenting for chief complaint of cough, congestion, chills, and sore throat that started 2 days ago. Sore throat is a "burning" sensation and is currently her most bothersome symptom. Denies acid reflux/gerd symptoms. Denies nausea, vomiting, diarrhea, abdominal pain, rash, and known fever at home. Recently in the hospital visiting her mother and wonders if she could have been exposed to viral illness while there. Never smoker, denies history of chronic respiratory problems like copd/asthma. She has been using OTC medications with minimal relief.    Cough   Past Medical History:  Diagnosis Date   Bronchitis    COVID-19 virus infection 07/2019   OSA (obstructive sleep apnea)    no cpap- last sleep study told no OSA   Seizure (HCC) 06/2019   07-01-2021 last seizure 04-2020    Patient Active Problem List   Diagnosis Date Noted   Prediabetes 05/30/2022   Elevated blood pressure reading without diagnosis of hypertension 05/30/2022   Alopecia 03/29/2022   History of bilateral breast reduction surgery 03/29/2022   Dyslipidemia 03/29/2022   Seizure disorder (HCC) 01/02/2020    Past Surgical History:  Procedure Laterality Date   ABDOMINAL HYSTERECTOMY     1987   BREAST REDUCTION SURGERY  2004   COLONOSCOPY     last colon > 10 yrs ago- last 2 colon's pt states had polyps- we have no records , no path   POLYPECTOMY     REDUCTION MAMMAPLASTY Bilateral    tummy tuck  2008    OB History   No obstetric history on file.      Home Medications    Prior to Admission medications   Medication Sig Start Date End Date Taking? Authorizing Provider  benzonatate (TESSALON) 100 MG capsule Take 1 capsule (100 mg total) by mouth every 8  (eight) hours. 10/16/23  Yes Carlisle Beers, FNP  LamoTRIgine 200 MG TB24 24 hour tablet Take 1 tablet (200 mg total) by mouth daily. 03/23/23  Yes Lomax, Amy, NP  lidocaine (XYLOCAINE) 2 % solution Use as directed 15 mLs in the mouth or throat as needed for mouth pain. 10/16/23  Yes Carlisle Beers, FNP  amcinonide (CYCLOCORT) 0.1 % ointment Apply topically 2 (two) times daily. Patient not taking: Reported on 03/23/2023 08/09/21   Georgina Quint, MD  erythromycin ophthalmic ointment SMARTSIG:Sparingly Right Eye 6 Times Daily 04/21/21   [provider]  MURO 128 5 % ophthalmic solution Place 1 drop into the left eye 4 (four) times daily. 11/08/21   [provider]  rosuvastatin (CRESTOR) 10 MG tablet Take 1 tablet (10 mg total) by mouth daily. 12/06/21   Georgina Quint, MD  metFORMIN (GLUCOPHAGE) 500 MG tablet Take 500 mg by mouth daily.  07/21/19  [provider]    Family History Family History  Problem Relation Age of Onset   Hypertension Mother    Heart disease Mother    Hypertension Sister    Diabetes Brother    Hypertension Brother    Colon cancer Paternal Aunt    Heart disease Maternal Grandmother    Alzheimer's disease Other    Colon polyps Neg Hx    Esophageal cancer Neg  Hx    Rectal cancer Neg Hx    Stomach cancer Neg Hx     Social History Social History   Tobacco Use   Smoking status: Never   Smokeless tobacco: Never  Vaping Use   Vaping status: Never Used  Substance Use Topics   Alcohol use: Yes    Comment: 2 x week   Drug use: Never     Allergies   Patient has no known allergies.   Review of Systems Review of Systems  Respiratory:  Positive for cough.   Per HPI   Physical Exam Triage Vital Signs ED Triage Vitals [10/16/23 1938]  Encounter Vitals Group     BP (!) 152/90     Systolic BP Percentile      Diastolic BP Percentile      Pulse Rate 82     Resp 18     Temp 98.8 F (37.1 C)     Temp Source  Oral     SpO2 94 %     Weight      Height      Head Circumference      Peak Flow      Pain Score 0     Pain Loc      Pain Education      Exclude from Growth Chart    No data found.  Updated Vital Signs BP (!) 152/90 (BP Location: Right Arm)   Pulse 82   Temp 98.8 F (37.1 C) (Oral)   Resp 18   SpO2 94%   Visual Acuity Right Eye Distance:   Left Eye Distance:   Bilateral Distance:    Right Eye Near:   Left Eye Near:    Bilateral Near:     Physical Exam Vitals and nursing note reviewed.  Constitutional:      Appearance: She is not ill-appearing or toxic-appearing.  HENT:     Head: Normocephalic and atraumatic.     Right Ear: Hearing, tympanic membrane, ear canal and external ear normal.     Left Ear: Hearing, tympanic membrane, ear canal and external ear normal.     Nose: Congestion present.     Mouth/Throat:     Lips: Pink.     Mouth: Mucous membranes are moist. No injury or oral lesions.     Dentition: Normal dentition.     Tongue: No lesions.     Pharynx: Oropharynx is clear. Uvula midline. Posterior oropharyngeal erythema present. No pharyngeal swelling, oropharyngeal exudate, uvula swelling or postnasal drip.     Tonsils: No tonsillar exudate.     Comments: Mild erythema to posterior oropharynx with small amount of clear postnasal drainage visualized.No trismus, phonation normal, maintaining secretions without difficulty.  Eyes:     General: Lids are normal. Vision grossly intact. Gaze aligned appropriately.     Extraocular Movements: Extraocular movements intact.     Conjunctiva/sclera: Conjunctivae normal.  Neck:     Trachea: Trachea and phonation normal.  Cardiovascular:     Rate and Rhythm: Normal rate and regular rhythm.     Heart sounds: Normal heart sounds, S1 normal and S2 normal.  Pulmonary:     Effort: Pulmonary effort is normal. No respiratory distress.     Breath sounds: Normal breath sounds and air entry. No wheezing, rhonchi or rales.   Chest:     Chest wall: No tenderness.  Musculoskeletal:     Cervical back: Neck supple.  Lymphadenopathy:     Cervical: No cervical adenopathy.  Skin:  General: Skin is warm and dry.     Capillary Refill: Capillary refill takes less than 2 seconds.     Findings: No rash.  Neurological:     General: No focal deficit present.     Mental Status: She is alert and oriented to person, place, and time. Mental status is at baseline.     Cranial Nerves: No dysarthria or facial asymmetry.  Psychiatric:        Mood and Affect: Mood normal.        Speech: Speech normal.        Behavior: Behavior normal.        Thought Content: Thought content normal.        Judgment: Judgment normal.      UC Treatments / Results  Labs (all labs ordered are listed, but only abnormal results are displayed) Labs Reviewed  POC COVID19/FLU A&B COMBO - Normal  SARS CORONAVIRUS 2 (TAT 6-24 HRS)    EKG   Radiology No results found.  Procedures Procedures (including critical care time)  Medications Ordered in UC Medications - No data to display  Initial Impression / Assessment and Plan / UC Course  I have reviewed the triage vital signs and the nursing notes.  Pertinent labs & imaging results that were available during my care of the patient were reviewed by me and considered in my medical decision making (see chart for details).   1. Viral URI with cough Suspect viral URI, viral syndrome.  Strep/viral testing: POC COVID and flu negative, PCR COVID pending. Staff will call if COVID is positive via PCR.   Physical exam findings reassuring, vital signs hemodynamically stable, and lungs clear, therefore deferred imaging of the chest.  Advised supportive care/prescriptions for symptomatic relief as outlined in AVS.    Counseled patient on potential for adverse effects with medications prescribed/recommended today, strict ER and return-to-clinic precautions discussed, patient verbalized  understanding.    Final Clinical Impressions(s) / UC Diagnoses   Final diagnoses:  Viral URI with cough     Discharge Instructions      You have a viral illness which will improve on its own with rest, fluids, and medications to help with your symptoms.  Tylenol, guaifenesin (plain mucinex), and saline nasal sprays may help relieve symptoms.   Use lidocaine to the back of the throat every 4-6 hours as needed for throat pain and inflammation.  Tessalon perles every 8 hours as needed for cough.  Two teaspoons of honey in 1 cup of warm water every 4-6 hours may help with throat pains.  Humidifier in room at nighttime may help soothe cough (clean well daily).   For chest pain, shortness of breath, inability to keep food or fluids down without vomiting, fever that does not respond to tylenol or motrin, or any other severe symptoms, please go to the ER for further evaluation. Return to urgent care as needed, otherwise follow-up with PCP.       ED Prescriptions     Medication Sig Dispense Auth. Provider   lidocaine (XYLOCAINE) 2 % solution Use as directed 15 mLs in the mouth or throat as needed for mouth pain. 100 mL Reita May M, FNP   benzonatate (TESSALON) 100 MG capsule Take 1 capsule (100 mg total) by mouth every 8 (eight) hours. 21 capsule Carlisle Beers, FNP      PDMP not reviewed this encounter.   Carlisle Beers, [place] 10/16/23 2103

## 2023-10-16 NOTE — Discharge Instructions (Signed)
 You have a viral illness which will improve on its own with rest, fluids, and medications to help with your symptoms.  Tylenol, guaifenesin (plain mucinex), and saline nasal sprays may help relieve symptoms.   Use lidocaine to the back of the throat every 4-6 hours as needed for throat pain and inflammation.  Tessalon perles every 8 hours as needed for cough.  Two teaspoons of honey in 1 cup of warm water every 4-6 hours may help with throat pains.  Humidifier in room at nighttime may help soothe cough (clean well daily).   For chest pain, shortness of breath, inability to keep food or fluids down without vomiting, fever that does not respond to tylenol or motrin, or any other severe symptoms, please go to the ER for further evaluation. Return to urgent care as needed, otherwise follow-up with PCP.

## 2023-10-16 NOTE — ED Triage Notes (Signed)
 Congestion, cough, sore throat, headache x 2 days. Taking coricidin, Theraflu.

## 2023-10-18 LAB — SARS CORONAVIRUS 2 (TAT 6-24 HRS): SARS Coronavirus 2: NEGATIVE

## 2023-10-19 ENCOUNTER — Encounter: Payer: Self-pay | Admitting: Emergency Medicine

## 2023-10-19 ENCOUNTER — Ambulatory Visit: Admitting: Emergency Medicine

## 2023-10-19 ENCOUNTER — Ambulatory Visit (INDEPENDENT_AMBULATORY_CARE_PROVIDER_SITE_OTHER)

## 2023-10-19 VITALS — BP 132/80 | HR 83 | Temp 98.3°F | Ht 62.0 in | Wt 170.8 lb

## 2023-10-19 DIAGNOSIS — G40909 Epilepsy, unspecified, not intractable, without status epilepticus: Secondary | ICD-10-CM | POA: Diagnosis not present

## 2023-10-19 DIAGNOSIS — J22 Unspecified acute lower respiratory infection: Secondary | ICD-10-CM

## 2023-10-19 DIAGNOSIS — R053 Chronic cough: Secondary | ICD-10-CM | POA: Insufficient documentation

## 2023-10-19 DIAGNOSIS — E785 Hyperlipidemia, unspecified: Secondary | ICD-10-CM

## 2023-10-19 DIAGNOSIS — R7303 Prediabetes: Secondary | ICD-10-CM | POA: Diagnosis not present

## 2023-10-19 LAB — LIPID PANEL
Cholesterol: 200 mg/dL (ref 0–200)
HDL: 46.2 mg/dL (ref >39.00)
LDL Cholesterol: 136 mg/dL — ABNORMAL HIGH (ref 0–99)
NonHDL: 153.77
Total CHOL/HDL Ratio: 4
Triglycerides: 87 mg/dL (ref 0.0–149.0)
VLDL: 17.4 mg/dL (ref 0.0–40.0)

## 2023-10-19 LAB — CBC WITH DIFFERENTIAL/PLATELET
Basophils Absolute: 0.1 10*3/uL (ref 0.0–0.1)
Basophils Relative: 1.2 % (ref 0.0–3.0)
Eosinophils Absolute: 0.2 10*3/uL (ref 0.0–0.7)
Eosinophils Relative: 3.8 % (ref 0.0–5.0)
HCT: 39.1 % (ref 36.0–46.0)
Hemoglobin: 12.9 g/dL (ref 12.0–15.0)
Lymphocytes Relative: 27.9 % (ref 12.0–46.0)
Lymphs Abs: 1.2 10*3/uL (ref 0.7–4.0)
MCHC: 33 g/dL (ref 30.0–36.0)
MCV: 87.8 fl (ref 78.0–100.0)
Monocytes Absolute: 0.7 10*3/uL (ref 0.1–1.0)
Monocytes Relative: 16.4 % — ABNORMAL HIGH (ref 3.0–12.0)
Neutro Abs: 2.1 10*3/uL (ref 1.4–7.7)
Neutrophils Relative %: 50.7 % (ref 43.0–77.0)
Platelets: 272 10*3/uL (ref 150.0–400.0)
RBC: 4.45 Mil/uL (ref 3.87–5.11)
RDW: 13.3 % (ref 11.5–15.5)
WBC: 4.2 10*3/uL (ref 4.0–10.5)

## 2023-10-19 LAB — COMPREHENSIVE METABOLIC PANEL WITH GFR
ALT: 27 U/L (ref 0–35)
AST: 34 U/L (ref 0–37)
Albumin: 4.5 g/dL (ref 3.5–5.2)
Alkaline Phosphatase: 52 U/L (ref 39–117)
BUN: 12 mg/dL (ref 6–23)
CO2: 29 meq/L (ref 19–32)
Calcium: 9.3 mg/dL (ref 8.4–10.5)
Chloride: 99 meq/L (ref 96–112)
Creatinine, Ser: 0.82 mg/dL (ref 0.40–1.20)
GFR: 76.31 mL/min (ref >60.00)
Glucose, Bld: 104 mg/dL — ABNORMAL HIGH (ref 70–99)
Potassium: 4.1 meq/L (ref 3.5–5.1)
Sodium: 135 meq/L (ref 135–145)
Total Bilirubin: 0.4 mg/dL (ref 0.2–1.2)
Total Protein: 7.4 g/dL (ref 6.0–8.3)

## 2023-10-19 LAB — HEMOGLOBIN A1C: Hgb A1c MFr Bld: 6.3 % (ref 4.6–6.5)

## 2023-10-19 MED ORDER — HYDROCODONE BIT-HOMATROP MBR 5-1.5 MG/5ML PO SOLN: 5.0000 mL | Freq: Every evening | ORAL | 0 refills | Status: DC | PRN

## 2023-10-19 MED ORDER — AZITHROMYCIN 250 MG PO TABS: ORAL_TABLET | ORAL | 0 refills | Status: DC

## 2023-10-19 NOTE — Assessment & Plan Note (Signed)
 Viral upper respiratory infection now with secondary bacterial infection. Recommend daily azithromycin for 5 days Chest x-ray with findings of acute bronchitis No pneumonia Clinically stable.  No red flag signs or symptoms Advised to rest and stay well-hydrated Symptom management discussed Advised to contact the office if no better or worse during the next several days

## 2023-10-19 NOTE — Assessment & Plan Note (Signed)
 Stable.  Hemoglobin A1c done today. Diet and nutrition discussed. Advised to decrease amount of daily carbohydrate intake and daily calories and increase amount of plant based protein in her diet

## 2023-10-19 NOTE — Patient Instructions (Signed)

## 2023-10-19 NOTE — Assessment & Plan Note (Signed)
 Stable. Diet and nutrition discussed.  Not taking rosuvastatin at present time Lipid profile done today The 10-year ASCVD risk score (Arnett DK, et al., 2019) is: 8.7%   Values used to calculate the score:     Age: 63 years     Sex: Female     Is Non-Hispanic African American: Yes     Diabetic: No     Tobacco smoker: No     Systolic Blood Pressure: 132 mmHg     Is BP treated: No     HDL Cholesterol: 51.5 mg/dL     Total Cholesterol: 237 mg/dL

## 2023-10-19 NOTE — Assessment & Plan Note (Signed)
 Cough management discussed Recommend over-the-counter Mucinex DM and cough drops Continue Tessalon 200 mg 3 times a day Hycodan syrup at bedtime Advised to rest and stay well-hydrated

## 2023-10-19 NOTE — Assessment & Plan Note (Signed)
Stable.  Seizure-free for a while now.  Continues lamotrigine 200 mg daily and follows up with neurologist regularly

## 2023-10-19 NOTE — Progress Notes (Signed)
 Jane Heath 63 y.o.   Chief Complaint  Patient presents with   Cough    Patient has been having a cough since Saturday and got worse Sunday she did go to urgent care they did test her for covid, flu, strep and all negative and they gave her Tessalon perles and she states it not helping. She is fatigue states No fever, no body aches. She does also wants to talk about going back on her cholesterol meds. Patient wants to do a physical today. Last patient mentions getting bit by a tick last week, patient states the site is discolored     HISTORY OF PRESENT ILLNESS: This is a 63 y.o. female complaining of persistent cough and flulike symptoms that started last Saturday.  Was seen at urgent care center.  Negative flu and COVID test.  Negative strep.  Was started on [employer].  Has been taking over-the-counter Mucinex.  Still complaining of dry cough. Also relates a recent tick bite to left lower abdomen area. History of high cholesterol.  Not taking cholesterol medication. Requesting blood work. Needs to schedule another appointment for physical. BP Readings from Last 3 Encounters:  10/19/23 132/80  10/16/23 (!) 152/90  03/23/23 133/82   Lab Results  Component Value Date   CHOL 237 (H) 12/01/2021   HDL 51.50 12/01/2021   LDLCALC 152 (H) 12/01/2021   TRIG 166.0 (H) 12/01/2021   CHOLHDL 5 12/01/2021   The 10-year ASCVD risk score (Arnett DK, et al., 2019) is: 8.7%   Values used to calculate the score:     Age: 26 years     Sex: Female     Is Non-Hispanic African American: Yes     Diabetic: No     Tobacco smoker: No     Systolic Blood Pressure: 132 mmHg     Is BP treated: No     HDL Cholesterol: 51.5 mg/dL     Total Cholesterol: 237 mg/dL   Cough Pertinent negatives include no chest pain, chills, fever, headaches, rash, sore throat or shortness of breath.     Prior to Admission medications   Medication Sig Start Date End Date Taking? Authorizing Provider   benzonatate (TESSALON) 100 MG capsule Take 1 capsule (100 mg total) by mouth every 8 (eight) hours. 10/16/23  Yes Carlisle Beers, FNP  erythromycin ophthalmic ointment SMARTSIG:Sparingly Right Eye 6 Times Daily 04/21/21  Yes [provider]  LamoTRIgine 200 MG TB24 24 hour tablet Take 1 tablet (200 mg total) by mouth daily. 03/23/23  Yes Lomax, Amy, NP  MURO 128 5 % ophthalmic solution Place 1 drop into the left eye 4 (four) times daily. 11/08/21  Yes [provider]  amcinonide (CYCLOCORT) 0.1 % ointment Apply topically 2 (two) times daily. Patient not taking: Reported on 10/19/2023 08/09/21   Georgina Quint, MD  lidocaine (XYLOCAINE) 2 % solution Use as directed 15 mLs in the mouth or throat as needed for mouth pain. Patient not taking: Reported on 10/19/2023 10/16/23   Carlisle Beers, FNP  rosuvastatin (CRESTOR) 10 MG tablet Take 1 tablet (10 mg total) by mouth daily. Patient not taking: Reported on 10/19/2023 12/06/21   Georgina Quint, MD  metFORMIN (GLUCOPHAGE) 500 MG tablet Take 500 mg by mouth daily.  07/21/19  [provider]    No Known Allergies  Patient Active Problem List   Diagnosis Date Noted   Prediabetes 05/30/2022   Elevated blood pressure reading without diagnosis of hypertension 05/30/2022  Alopecia 03/29/2022   History of bilateral breast reduction surgery 03/29/2022   Dyslipidemia 03/29/2022   Seizure disorder (HCC) 01/02/2020    Past Medical History:  Diagnosis Date   Bronchitis    COVID-19 virus infection 07/2019   OSA (obstructive sleep apnea)    no cpap- last sleep study told no OSA   Seizure (HCC) 06/2019   07-01-2021 last seizure 04-2020    Past Surgical History:  Procedure Laterality Date   ABDOMINAL HYSTERECTOMY     1987   BREAST REDUCTION SURGERY  2004   COLONOSCOPY     last colon > 10 yrs ago- last 2 colon's pt states had polyps- we have no records , no path   POLYPECTOMY     REDUCTION MAMMAPLASTY  Bilateral    tummy tuck  2008    Social History   Socioeconomic History   Marital status: Single    Spouse name: Not on file   Number of children: 2   Years of education: Not on file   Highest education level: Not on file  Occupational History    Comment: third shift  Tobacco Use   Smoking status: Never   Smokeless tobacco: Never  Vaping Use   Vaping status: Never Used  Substance and Sexual Activity   Alcohol use: Yes    Comment: 2 x week   Drug use: Never   Sexual activity: Not on file  Other Topics Concern   Not on file  Social History Narrative   Lives with mother.  Works for [employer].  Education college 2 yrs.  Divorced.  No children.  Caffeine 12-16 oz daily.   Social Drivers of Corporate investment banker Strain: Not on file  Food Insecurity: Not on file  Transportation Needs: Not on file  Physical Activity: Not on file  Stress: Not on file  Social Connections: Unknown (11/23/2021)   Received from [facility]   Social Network    Social Network: Not on file  Intimate Partner Violence: Unknown (10/15/2021)   Received from [facility], [facility]   HITS    Physically Hurt: Not on file    Insult or Talk Down To: Not on file    Threaten Physical Harm: Not on file    Scream or Curse: Not on file    Family History  Problem Relation Age of Onset   Hypertension Mother    Heart disease Mother    Hypertension Sister    Diabetes Brother    Hypertension Brother    Colon cancer Paternal Aunt    Heart disease Maternal Grandmother    Alzheimer's disease Other    Colon polyps Neg Hx    Esophageal cancer Neg Hx    Rectal cancer Neg Hx    Stomach cancer Neg Hx      Review of Systems  Constitutional: Negative.  Negative for chills and fever.  HENT: Negative.  Negative for congestion and sore throat.   Respiratory:  Positive for cough. Negative for shortness of breath.   Cardiovascular: Negative.  Negative for chest pain and palpitations.   Gastrointestinal:  Negative for abdominal pain, nausea and vomiting.  Genitourinary: Negative.  Negative for dysuria and hematuria.  Skin: Negative.  Negative for rash.  Neurological: Negative.  Negative for dizziness and headaches.  All other systems reviewed and are negative.   Today's Vitals   10/19/23 1008  BP: 132/80  Pulse: 83  Temp: 98.3 F (36.8 C)  TempSrc: Oral  SpO2: 95%  Weight: 170 lb 12.8 oz (77.5 kg)  Height: 5\' 2"  (1.575 m)   Body mass index is 31.24 kg/m.   Physical Exam Vitals reviewed.  Constitutional:      Appearance: Normal appearance.  HENT:     Head: Normocephalic.     Mouth/Throat:     Mouth: Mucous membranes are moist.     Pharynx: Oropharynx is clear.  Eyes:     Extraocular Movements: Extraocular movements intact.     Conjunctiva/sclera: Conjunctivae normal.     Pupils: Pupils are equal, round, and reactive to light.  Cardiovascular:     Rate and Rhythm: Normal rate and regular rhythm.     Pulses: Normal pulses.     Heart sounds: Normal heart sounds.  Pulmonary:     Effort: Pulmonary effort is normal.     Breath sounds: Normal breath sounds.  Musculoskeletal:     Cervical back: No tenderness.  Lymphadenopathy:     Cervical: No cervical adenopathy.  Skin:    General: Skin is warm and dry.     Capillary Refill: Capillary refill takes less than 2 seconds.  Neurological:     General: No focal deficit present.     Mental Status: She is alert and oriented to person, place, and time.  Psychiatric:        Mood and Affect: Mood normal.        Behavior: Behavior normal.    DG Chest 2 View Result Date: 10/19/2023 CLINICAL DATA:  Persistent cough EXAM: CHEST - 2 VIEW COMPARISON:  None Available. FINDINGS: No evidence of discrete lobar infiltrate. Heart size is within normal limits. There is mild peribronchial thickening. No pleural effusion or pneumothorax. IMPRESSION: 1. Mild peribronchial thickening which can be observed in the setting of  reactive airway disease, edema, and atypical infectious etiologies. Electronically Signed   By: Lowell Guitar M.D.   On: 10/19/2023 11:26     ASSESSMENT & PLAN: A total of 42 minutes was spent with the patient and counseling/coordination of care regarding preparing for this visit, review of most recent office visit notes, review of multiple chronic medical conditions and their management, review of chest x-ray images done today, diagnosis of acute bronchitis and need for antibiotics, symptom management, review of all medications, review of most recent bloodwork results, review of health maintenance items, education on nutrition, prognosis, documentation, and need for follow up.  Problem List Items Addressed This Visit       Respiratory   Lower respiratory infection - Primary   Viral upper respiratory infection now with secondary bacterial infection. Recommend daily azithromycin for 5 days Chest x-ray with findings of acute bronchitis No pneumonia Clinically stable.  No red flag signs or symptoms Advised to rest and stay well-hydrated Symptom management discussed Advised to contact the office if no better or worse during the next several days      Relevant Medications   azithromycin (ZITHROMAX) 250 MG tablet   Other Relevant Orders   CBC with Differential/Platelet   DG Chest 2 View (Completed)     Nervous and Auditory   Seizure disorder (HCC)   Stable. Seizure-free for a while now. Continues lamotrigine 200 mg daily and follows up with neurologist regularly         Other   Dyslipidemia   Stable. Diet and nutrition discussed.  Not taking rosuvastatin at present time Lipid profile done today The 10-year ASCVD risk score (Arnett DK, et al., 2019) is: 8.7%   Values used to calculate  the score:     Age: 89 years     Sex: Female     Is Non-Hispanic African American: Yes     Diabetic: No     Tobacco smoker: No     Systolic Blood Pressure: 132 mmHg     Is BP treated: No     HDL  Cholesterol: 51.5 mg/dL     Total Cholesterol: 237 mg/dL       Relevant Orders   Comprehensive metabolic panel with GFR   Lipid panel   Prediabetes   Stable.  Hemoglobin A1c done today. Diet and nutrition discussed. Advised to decrease amount of daily carbohydrate intake and daily calories and increase amount of plant based protein in her diet      Relevant Orders   Comprehensive metabolic panel with GFR   Hemoglobin A1c   Persistent cough   Cough management discussed Recommend over-the-counter Mucinex DM and cough drops Continue Tessalon 200 mg 3 times a day Hycodan syrup at bedtime Advised to rest and stay well-hydrated      Relevant Medications   HYDROcodone bit-homatropine (HYCODAN) 5-1.5 MG/5ML syrup   Other Relevant Orders   DG Chest 2 View (Completed)   Patient Instructions  Cough, Adult A cough helps to clear your throat and lungs. It may be a sign of an illness or another condition. A short-term (acute) cough may last 2-3 weeks. A long-term (chronic) cough may last 8 or more weeks. Many things can cause a cough. They include: Illnesses such as: An infection in your throat or lungs. Asthma or other heart or lung problems. Gastroesophageal reflux. This is when acid comes back up from your stomach. Breathing in things that bother (irritate) your lungs. Allergies. Postnasal drip. This is when mucus runs down the back of your throat. Smoking. Some medicines. Follow these instructions at home: Medicines Take over-the-counter and prescription medicines only as told by your doctor. Talk with your doctor before you take cough medicine (cough suppressants). Eating and drinking Do not drink alcohol. Do not drink caffeine. Drink enough fluid to keep your pee (urine) pale yellow. Lifestyle Stay away from cigarette smoke. Do not smoke or use any products that contain nicotine or tobacco. If you need help quitting, ask your doctor. Stay away from things that make you  cough. These may include perfume, candles, cleaning products, or campfire smoke. General instructions  Watch for any changes to your cough. Tell your doctor about them. Always cover your mouth when you cough. If the air is dry in your home, use a cool mist vaporizer or humidifier. If your cough is worse at night, try using extra pillows to raise your head up higher while you sleep. Rest as needed. Contact a doctor if: You have new symptoms. Your symptoms get worse. You cough up pus. You have a fever that does not go away. Your cough does not get better after 2-3 weeks. Cough medicine does not help, and you are not sleeping well. You have pain that gets worse or is not helped with medicine. You are losing weight and do not know why. You have night sweats. Get help right away if: You cough up blood. You have trouble breathing. Your heart is beating very fast. These symptoms may be an emergency. Get help right away. Call 911. Do not wait to see if the symptoms will go away. Do not drive yourself to the hospital. This information is not intended to replace advice given to you by your health care  provider. Make sure you discuss any questions you have with your health care provider. Document Revised: 02/25/2022 Document Reviewed: 02/25/2022 Elsevier Patient Education  2024 Elsevier Inc.   Edwina Barth, MD [facility]

## 2024-01-10 ENCOUNTER — Encounter: Admitting: Emergency Medicine

## 2024-02-14 ENCOUNTER — Ambulatory Visit: Admitting: Emergency Medicine

## 2024-02-14 ENCOUNTER — Encounter: Payer: Self-pay | Admitting: Emergency Medicine

## 2024-02-14 VITALS — BP 140/88 | HR 73 | Temp 97.6°F | Ht 62.0 in | Wt 169.0 lb

## 2024-02-14 DIAGNOSIS — Z8659 Personal history of other mental and behavioral disorders: Secondary | ICD-10-CM

## 2024-02-14 DIAGNOSIS — E785 Hyperlipidemia, unspecified: Secondary | ICD-10-CM | POA: Diagnosis not present

## 2024-02-14 DIAGNOSIS — G40909 Epilepsy, unspecified, not intractable, without status epilepticus: Secondary | ICD-10-CM

## 2024-02-14 DIAGNOSIS — I10 Essential (primary) hypertension: Secondary | ICD-10-CM

## 2024-02-14 DIAGNOSIS — R41840 Attention and concentration deficit: Secondary | ICD-10-CM

## 2024-02-14 DIAGNOSIS — R7303 Prediabetes: Secondary | ICD-10-CM

## 2024-02-14 MED ORDER — AMLODIPINE BESYLATE 5 MG PO TABS
5.0000 mg | ORAL_TABLET | Freq: Every day | ORAL | 3 refills | Status: AC
Start: 2024-02-14 — End: ?

## 2024-02-14 MED ORDER — ROSUVASTATIN CALCIUM 10 MG PO TABS: 10.0000 mg | ORAL_TABLET | Freq: Every day | ORAL | 3 refills | Status: AC

## 2024-02-14 NOTE — Assessment & Plan Note (Signed)
 Stable.  Lab Results  Component Value Date   HGBA1C 6.3 10/19/2023    Diet and nutrition discussed. Advised to decrease amount of daily carbohydrate intake and daily calories and increase amount of plant based protein in her diet

## 2024-02-14 NOTE — Progress Notes (Signed)
 Jane Heath 63 y.o.   Chief Complaint  Patient presents with   ADHD    Patient states she's has been having trouble concentrating and it is effecting her at work. She mentions back in [place] she was prescribed adderall years ago and wanted to see if she is able to get something to help her     HISTORY OF PRESENT ILLNESS: This is a 63 y.o. female complaining of concentration problems. States she has a history of ADD since high school. Many years ago she was started on Adderall which she took for 2 to 3 years and it helped. Has history of seizure disorder on lamotrigine  History of dyslipidemia but not taking medication at present time No other complaints or medical concerns today.  HPI   Prior to Admission medications   Medication Sig Start Date End Date Taking? Authorizing Provider  LamoTRIgine  200 MG TB24 24 hour tablet Take 1 tablet (200 mg total) by mouth daily. 03/23/23  Yes Lomax, Amy, NP  amcinonide  (CYCLOCORT ) 0.1 % ointment Apply topically 2 (two) times daily. Patient not taking: Reported on 02/14/2024 08/09/21   Cimone Fahey Jose, MD  azithromycin  (ZITHROMAX ) 250 MG tablet Sig as indicated Patient not taking: Reported on 02/14/2024 10/19/23   Alece Koppel Jose, MD  benzonatate  (TESSALON ) 100 MG capsule Take 1 capsule (100 mg total) by mouth every 8 (eight) hours. 10/16/23   Enedelia Dorna CHRISTELLA, FNP  erythromycin ophthalmic ointment SMARTSIG:Sparingly Right Eye 6 Times Daily 04/21/21   [provider]  HYDROcodone  bit-homatropine (HYCODAN) 5-1.5 MG/5ML syrup Take 5 mLs by mouth at bedtime as needed for cough. 10/19/23   Purcell Emil Schanz, MD  lidocaine  (XYLOCAINE ) 2 % solution Use as directed 15 mLs in the mouth or throat as needed for mouth pain. Patient not taking: Reported on 10/19/2023 10/16/23   Enedelia Dorna CHRISTELLA, FNP  MURO 128 5 % ophthalmic solution Place 1 drop into the left eye 4 (four) times daily. 11/08/21   [provider]  rosuvastatin   (CRESTOR ) 10 MG tablet Take 1 tablet (10 mg total) by mouth daily. Patient not taking: Reported on 10/19/2023 12/06/21   Purcell Emil Schanz, MD  metFORMIN (GLUCOPHAGE) 500 MG tablet Take 500 mg by mouth daily.  07/21/19  [provider]    No Known Allergies  Patient Active Problem List   Diagnosis Date Noted   Prediabetes 05/30/2022   Elevated blood pressure reading without diagnosis of hypertension 05/30/2022   Alopecia 03/29/2022   History of bilateral breast reduction surgery 03/29/2022   Dyslipidemia 03/29/2022   Seizure disorder (HCC) 01/02/2020    Past Medical History:  Diagnosis Date   Bronchitis    COVID-19 virus infection 07/2019   OSA (obstructive sleep apnea)    no cpap- last sleep study told no OSA   Seizure (HCC) 06/2019   07-01-2021 last seizure 04-2020    Past Surgical History:  Procedure Laterality Date   ABDOMINAL HYSTERECTOMY     1987   BREAST REDUCTION SURGERY  2004   COLONOSCOPY     last colon > 10 yrs ago- last 2 colon's pt states had polyps- we have no records , no path   POLYPECTOMY     REDUCTION MAMMAPLASTY Bilateral    tummy tuck  2008    Social History   Socioeconomic History   Marital status: Single    Spouse name: Not on file   Number of children: 2   Years of education: Not on file   Highest  education level: Not on file  Occupational History    Comment: third shift  Tobacco Use   Smoking status: Never   Smokeless tobacco: Never  Vaping Use   Vaping status: Never Used  Substance and Sexual Activity   Alcohol use: Yes    Comment: 2 x week   Drug use: Never   Sexual activity: Not on file  Other Topics Concern   Not on file  Social History Narrative   Lives with mother.  Works for [employer].  Education college 2 yrs.  Divorced.  No children.  Caffeine 12-16 oz daily.   Social Drivers of Corporate investment banker Strain: Not on file  Food Insecurity: Not on file  Transportation Needs: Not on file  Physical  Activity: Not on file  Stress: Not on file  Social Connections: Unknown (11/23/2021)   Received from [facility]   Social Network    Social Network: Not on file  Intimate Partner Violence: Unknown (10/15/2021)   Received from [facility]   HITS    Physically Hurt: Not on file    Insult or Talk Down To: Not on file    Threaten Physical Harm: Not on file    Scream or Curse: Not on file    Family History  Problem Relation Age of Onset   Hypertension Mother    Heart disease Mother    Hypertension Sister    Diabetes Brother    Hypertension Brother    Colon cancer Paternal Aunt    Heart disease Maternal Grandmother    Alzheimer's disease Other    Colon polyps Neg Hx    Esophageal cancer Neg Hx    Rectal cancer Neg Hx    Stomach cancer Neg Hx      Review of Systems  Constitutional: Negative.  Negative for chills and fever.  HENT: Negative.  Negative for congestion and sore throat.   Respiratory: Negative.  Negative for cough and shortness of breath.   Cardiovascular: Negative.  Negative for chest pain and palpitations.  Gastrointestinal:  Negative for abdominal pain, diarrhea, nausea and vomiting.  Genitourinary: Negative.  Negative for dysuria and hematuria.  Skin: Negative.  Negative for rash.  Neurological: Negative.  Negative for dizziness and headaches.  All other systems reviewed and are negative.   Today's Vitals   02/14/24 1330  BP: (!) 158/90  Pulse: 73  Temp: 97.6 F (36.4 C)  TempSrc: Oral  SpO2: 97%  Weight: 169 lb (76.7 kg)  Height: 5' 2 (1.575 m)   Body mass index is 30.91 kg/m.   Physical Exam Vitals reviewed.  Constitutional:      Appearance: Normal appearance.  HENT:     Head: Normocephalic.     Mouth/Throat:     Mouth: Mucous membranes are moist.     Pharynx: Oropharynx is clear.  Eyes:     Extraocular Movements: Extraocular movements intact.     Pupils: Pupils are equal, round, and reactive to light.  Cardiovascular:     Rate and  Rhythm: Normal rate and regular rhythm.     Pulses: Normal pulses.     Heart sounds: Normal heart sounds.  Pulmonary:     Effort: Pulmonary effort is normal.     Breath sounds: Normal breath sounds.  Musculoskeletal:     Cervical back: No tenderness.  Lymphadenopathy:     Cervical: No cervical adenopathy.  Skin:    General: Skin is warm and dry.     Capillary Refill: Capillary refill  takes less than 2 seconds.  Neurological:     Mental Status: She is alert and oriented to person, place, and time.  Psychiatric:        Mood and Affect: Mood normal.        Behavior: Behavior normal.      ASSESSMENT & PLAN: A total of 44 minutes was spent with the patient and counseling/coordination of care regarding preparing for this visit, review of most recent office visit notes, review of multiple chronic medical conditions and their management, review of all medications, review of most recent bloodwork results, review of health maintenance items, education on nutrition, prognosis, documentation, and need for follow up.   Problem List Items Addressed This Visit       Cardiovascular and Mediastinum   Essential hypertension - Primary   BP Readings from Last 3 Encounters:  02/14/24 (!) 158/90  10/19/23 132/80  10/16/23 (!) 152/90  Cardiovascular risks associated with uncontrolled hypertension discussed Dietary approaches to stop hypertension discussed Recommend to start amlodipine  5 mg daily Advised to monitor blood pressure readings at home daily for the next several weeks and contact the office if numbers persistently abnormal. Follow-up in 3 months       Relevant Medications   rosuvastatin  (CRESTOR ) 10 MG tablet   amLODipine  (NORVASC ) 5 MG tablet     Nervous and Auditory   Seizure disorder (HCC)   Stable. Seizure-free for a while now. Continues lamotrigine  200 mg daily and follows up with neurologist regularly         Other   Dyslipidemia   Diet and nutrition  discussed Recommend to start rosuvastatin  10 mg daily. The 10-year ASCVD risk score (Arnett DK, et al., 2019) is: 14.7%   Values used to calculate the score:     Age: 21 years     Clincally relevant sex: Female     Is Non-Hispanic African American: Yes     Diabetic: No     Tobacco smoker: No     Systolic Blood Pressure: 158 mmHg     Is BP treated: Yes     HDL Cholesterol: 46.2 mg/dL     Total Cholesterol: 200 mg/dL       Relevant Medications   rosuvastatin  (CRESTOR ) 10 MG tablet   Prediabetes   Stable.  Lab Results  Component Value Date   HGBA1C 6.3 10/19/2023    Diet and nutrition discussed. Advised to decrease amount of daily carbohydrate intake and daily calories and increase amount of plant based protein in her diet      Concentration deficit   History of ADHD. Recommend evaluation by [facility].  Referral placed today. Do not recommend Adderall given her hypertension out-of-control      Relevant Orders   Ambulatory referral to Psychiatry   History of ADHD   Relevant Orders   Ambulatory referral to Psychiatry   Patient Instructions  Start amlodipine  5 mg daily Purchase blood pressure machine and monitor blood pressure at home daily for the next several weeks.  Contact the office if numbers persistently abnormal. Follow-up in 3 months.  Hypertension, Adult High blood pressure (hypertension) is when the force of blood pumping through the arteries is too strong. The arteries are the blood vessels that carry blood from the heart throughout the body. Hypertension forces the heart to work harder to pump blood and may cause arteries to become narrow or stiff. Untreated or uncontrolled hypertension can lead to a heart attack, heart failure, a  stroke, kidney disease, and other problems. A blood pressure reading consists of a higher number over a lower number. Ideally, your blood pressure should be below 120/80. The first (top)  number is called the systolic pressure. It is a measure of the pressure in your arteries as your heart beats. The second (bottom) number is called the diastolic pressure. It is a measure of the pressure in your arteries as the heart relaxes. What are the causes? The exact cause of this condition is not known. There are some conditions that result in high blood pressure. What increases the risk? Certain factors may make you more likely to develop high blood pressure. Some of these risk factors are under your control, including: Smoking. Not getting enough exercise or physical activity. Being overweight. Having too much fat, sugar, calories, or salt (sodium) in your diet. Drinking too much alcohol. Other risk factors include: Having a personal history of heart disease, diabetes, high cholesterol, or kidney disease. Stress. Having a family history of high blood pressure and high cholesterol. Having obstructive sleep apnea. Age. The risk increases with age. What are the signs or symptoms? High blood pressure may not cause symptoms. Very high blood pressure (hypertensive crisis) may cause: Headache. Fast or irregular heartbeats (palpitations). Shortness of breath. Nosebleed. Nausea and vomiting. Vision changes. Severe chest pain, dizziness, and seizures. How is this diagnosed? This condition is diagnosed by measuring your blood pressure while you are seated, with your arm resting on a flat surface, your legs uncrossed, and your feet flat on the floor. The cuff of the blood pressure monitor will be placed directly against the skin of your upper arm at the level of your heart. Blood pressure should be measured at least twice using the same arm. Certain conditions can cause a difference in blood pressure between your right and left arms. If you have a high blood pressure reading during one visit or you have normal blood pressure with other risk factors, you may be asked to: Return on a  different day to have your blood pressure checked again. Monitor your blood pressure at home for 1 week or longer. If you are diagnosed with hypertension, you may have other blood or imaging tests to help your health care provider understand your overall risk for other conditions. How is this treated? This condition is treated by making healthy lifestyle changes, such as eating healthy foods, exercising more, and reducing your alcohol intake. You may be referred for counseling on a healthy diet and physical activity. Your health care provider may prescribe medicine if lifestyle changes are not enough to get your blood pressure under control and if: Your systolic blood pressure is above 130. Your diastolic blood pressure is above 80. Your personal target blood pressure may vary depending on your medical conditions, your age, and other factors. Follow these instructions at home: Eating and drinking  Eat a diet that is high in fiber and potassium, and low in sodium, added sugar, and fat. An example of this eating plan is called the DASH diet. DASH stands for Dietary Approaches to Stop Hypertension. To eat this way: Eat plenty of fresh fruits and vegetables. Try to fill one half of your plate at each meal with fruits and vegetables. Eat whole grains, such as whole-wheat pasta, brown rice, or whole-grain bread. Fill about one fourth of your plate with whole grains. Eat or drink low-fat dairy products, such as skim milk or low-fat yogurt. Avoid fatty cuts of meat, processed or cured meats,  and poultry with skin. Fill about one fourth of your plate with lean proteins, such as fish, chicken without skin, beans, eggs, or tofu. Avoid pre-made and processed foods. These tend to be higher in sodium, added sugar, and fat. Reduce your daily sodium intake. Many people with hypertension should eat less than 1,500 mg of sodium a day. Do not drink alcohol if: Your health care provider tells you not to drink. You  are pregnant, may be pregnant, or are planning to become pregnant. If you drink alcohol: Limit how much you have to: 0-1 drink a day for women. 0-2 drinks a day for men. Know how much alcohol is in your drink. In the [place] one drink equals one 12 oz bottle of beer (355 mL), one 5 oz glass of wine (148 mL), or one 1 oz glass of hard liquor (44 mL). Lifestyle  Work with your health care provider to maintain a healthy body weight or to lose weight. Ask what an ideal weight is for you. Get at least 30 minutes of exercise that causes your heart to beat faster (aerobic exercise) most days of the week. Activities may include walking, swimming, or biking. Include exercise to strengthen your muscles (resistance exercise), such as Pilates or lifting weights, as part of your weekly exercise routine. Try to do these types of exercises for 30 minutes at least 3 days a week. Do not use any products that contain nicotine or tobacco. These products include cigarettes, chewing tobacco, and vaping devices, such as e-cigarettes. If you need help quitting, ask your health care provider. Monitor your blood pressure at home as told by your health care provider. Keep all follow-up visits. This is important. Medicines Take over-the-counter and prescription medicines only as told by your health care provider. Follow directions carefully. Blood pressure medicines must be taken as prescribed. Do not skip doses of blood pressure medicine. Doing this puts you at risk for problems and can make the medicine less effective. Ask your health care provider about side effects or reactions to medicines that you should watch for. Contact a health care provider if you: Think you are having a reaction to a medicine you are taking. Have headaches that keep coming back (recurring). Feel dizzy. Have swelling in your ankles. Have trouble with your vision. Get help right away if you: Develop a severe headache or confusion. Have  unusual weakness or numbness. Feel faint. Have severe pain in your chest or abdomen. Vomit repeatedly. Have trouble breathing. These symptoms may be an emergency. Get help right away. Call 911. Do not wait to see if the symptoms will go away. Do not drive yourself to the hospital. Summary Hypertension is when the force of blood pumping through your arteries is too strong. If this condition is not controlled, it may put you at risk for serious complications. Your personal target blood pressure may vary depending on your medical conditions, your age, and other factors. For most people, a normal blood pressure is less than 120/80. Hypertension is treated with lifestyle changes, medicines, or a combination of both. Lifestyle changes include losing weight, eating a healthy, low-sodium diet, exercising more, and limiting alcohol. This information is not intended to replace advice given to you by your health care provider. Make sure you discuss any questions you have with your health care provider. Document Revised: 05/04/2021 Document Reviewed: 05/04/2021 Elsevier Patient Education  2024 Elsevier Inc.    Emil Schaumann, MD [facility]

## 2024-02-14 NOTE — Assessment & Plan Note (Signed)
 BP Readings from Last 3 Encounters:  02/14/24 (!) 158/90  10/19/23 132/80  10/16/23 (!) 152/90  Cardiovascular risks associated with uncontrolled hypertension discussed Dietary approaches to stop hypertension discussed Recommend to start amlodipine  5 mg daily Advised to monitor blood pressure readings at home daily for the next several weeks and contact the office if numbers persistently abnormal. Follow-up in 3 months

## 2024-02-14 NOTE — Assessment & Plan Note (Signed)
 History of ADHD. Recommend evaluation by [facility].  Referral placed today. Do not recommend Adderall given her hypertension out-of-control

## 2024-02-14 NOTE — Assessment & Plan Note (Signed)
Stable.  Seizure-free for a while now.  Continues lamotrigine 200 mg daily and follows up with neurologist regularly

## 2024-02-14 NOTE — Assessment & Plan Note (Signed)
 Diet and nutrition discussed Recommend to start rosuvastatin  10 mg daily. The 10-year ASCVD risk score (Arnett DK, et al., 2019) is: 14.7%   Values used to calculate the score:     Age: 63 years     Clincally relevant sex: Female     Is Non-Hispanic African American: Yes     Diabetic: No     Tobacco smoker: No     Systolic Blood Pressure: 158 mmHg     Is BP treated: Yes     HDL Cholesterol: 46.2 mg/dL     Total Cholesterol: 200 mg/dL

## 2024-02-14 NOTE — Patient Instructions (Signed)
 Start amlodipine  5 mg daily Purchase blood pressure machine and monitor blood pressure at home daily for the next several weeks.  Contact the office if numbers persistently abnormal. Follow-up in 3 months.  Hypertension, Adult High blood pressure (hypertension) is when the force of blood pumping through the arteries is too strong. The arteries are the blood vessels that carry blood from the heart throughout the body. Hypertension forces the heart to work harder to pump blood and may cause arteries to become narrow or stiff. Untreated or uncontrolled hypertension can lead to a heart attack, heart failure, a stroke, kidney disease, and other problems. A blood pressure reading consists of a higher number over a lower number. Ideally, your blood pressure should be below 120/80. The first (top) number is called the systolic pressure. It is a measure of the pressure in your arteries as your heart beats. The second (bottom) number is called the diastolic pressure. It is a measure of the pressure in your arteries as the heart relaxes. What are the causes? The exact cause of this condition is not known. There are some conditions that result in high blood pressure. What increases the risk? Certain factors may make you more likely to develop high blood pressure. Some of these risk factors are under your control, including: Smoking. Not getting enough exercise or physical activity. Being overweight. Having too much fat, sugar, calories, or salt (sodium) in your diet. Drinking too much alcohol. Other risk factors include: Having a personal history of heart disease, diabetes, high cholesterol, or kidney disease. Stress. Having a family history of high blood pressure and high cholesterol. Having obstructive sleep apnea. Age. The risk increases with age. What are the signs or symptoms? High blood pressure may not cause symptoms. Very high blood pressure (hypertensive crisis) may cause: Headache. Fast or  irregular heartbeats (palpitations). Shortness of breath. Nosebleed. Nausea and vomiting. Vision changes. Severe chest pain, dizziness, and seizures. How is this diagnosed? This condition is diagnosed by measuring your blood pressure while you are seated, with your arm resting on a flat surface, your legs uncrossed, and your feet flat on the floor. The cuff of the blood pressure monitor will be placed directly against the skin of your upper arm at the level of your heart. Blood pressure should be measured at least twice using the same arm. Certain conditions can cause a difference in blood pressure between your right and left arms. If you have a high blood pressure reading during one visit or you have normal blood pressure with other risk factors, you may be asked to: Return on a different day to have your blood pressure checked again. Monitor your blood pressure at home for 1 week or longer. If you are diagnosed with hypertension, you may have other blood or imaging tests to help your health care provider understand your overall risk for other conditions. How is this treated? This condition is treated by making healthy lifestyle changes, such as eating healthy foods, exercising more, and reducing your alcohol intake. You may be referred for counseling on a healthy diet and physical activity. Your health care provider may prescribe medicine if lifestyle changes are not enough to get your blood pressure under control and if: Your systolic blood pressure is above 130. Your diastolic blood pressure is above 80. Your personal target blood pressure may vary depending on your medical conditions, your age, and other factors. Follow these instructions at home: Eating and drinking  Eat a diet that is high in fiber  and potassium, and low in sodium, added sugar, and fat. An example of this eating plan is called the DASH diet. DASH stands for Dietary Approaches to Stop Hypertension. To eat this way: Eat  plenty of fresh fruits and vegetables. Try to fill one half of your plate at each meal with fruits and vegetables. Eat whole grains, such as whole-wheat pasta, brown rice, or whole-grain bread. Fill about one fourth of your plate with whole grains. Eat or drink low-fat dairy products, such as skim milk or low-fat yogurt. Avoid fatty cuts of meat, processed or cured meats, and poultry with skin. Fill about one fourth of your plate with lean proteins, such as fish, chicken without skin, beans, eggs, or tofu. Avoid pre-made and processed foods. These tend to be higher in sodium, added sugar, and fat. Reduce your daily sodium intake. Many people with hypertension should eat less than 1,500 mg of sodium a day. Do not drink alcohol if: Your health care provider tells you not to drink. You are pregnant, may be pregnant, or are planning to become pregnant. If you drink alcohol: Limit how much you have to: 0-1 drink a day for women. 0-2 drinks a day for men. Know how much alcohol is in your drink. In the [place] one drink equals one 12 oz bottle of beer (355 mL), one 5 oz glass of wine (148 mL), or one 1 oz glass of hard liquor (44 mL). Lifestyle  Work with your health care provider to maintain a healthy body weight or to lose weight. Ask what an ideal weight is for you. Get at least 30 minutes of exercise that causes your heart to beat faster (aerobic exercise) most days of the week. Activities may include walking, swimming, or biking. Include exercise to strengthen your muscles (resistance exercise), such as Pilates or lifting weights, as part of your weekly exercise routine. Try to do these types of exercises for 30 minutes at least 3 days a week. Do not use any products that contain nicotine or tobacco. These products include cigarettes, chewing tobacco, and vaping devices, such as e-cigarettes. If you need help quitting, ask your health care provider. Monitor your blood pressure at home as told by  your health care provider. Keep all follow-up visits. This is important. Medicines Take over-the-counter and prescription medicines only as told by your health care provider. Follow directions carefully. Blood pressure medicines must be taken as prescribed. Do not skip doses of blood pressure medicine. Doing this puts you at risk for problems and can make the medicine less effective. Ask your health care provider about side effects or reactions to medicines that you should watch for. Contact a health care provider if you: Think you are having a reaction to a medicine you are taking. Have headaches that keep coming back (recurring). Feel dizzy. Have swelling in your ankles. Have trouble with your vision. Get help right away if you: Develop a severe headache or confusion. Have unusual weakness or numbness. Feel faint. Have severe pain in your chest or abdomen. Vomit repeatedly. Have trouble breathing. These symptoms may be an emergency. Get help right away. Call 911. Do not wait to see if the symptoms will go away. Do not drive yourself to the hospital. Summary Hypertension is when the force of blood pumping through your arteries is too strong. If this condition is not controlled, it may put you at risk for serious complications. Your personal target blood pressure may vary depending on your medical conditions, your age,  and other factors. For most people, a normal blood pressure is less than 120/80. Hypertension is treated with lifestyle changes, medicines, or a combination of both. Lifestyle changes include losing weight, eating a healthy, low-sodium diet, exercising more, and limiting alcohol. This information is not intended to replace advice given to you by your health care provider. Make sure you discuss any questions you have with your health care provider. Document Revised: 05/04/2021 Document Reviewed: 05/04/2021 Elsevier Patient Education  2024 [employer].

## 2024-03-13 ENCOUNTER — Telehealth: Payer: Self-pay | Admitting: Family Medicine

## 2024-03-13 NOTE — Telephone Encounter (Signed)
 Patient calling to check appointment time and date to make sure will be a day is off of work. Patient will call to check with someone if she can trade shift to be able to come to appointment.

## 2024-03-28 ENCOUNTER — Ambulatory Visit: Payer: 59 | Admitting: Family Medicine

## 2024-04-17 ENCOUNTER — Ambulatory Visit: Admitting: Family Medicine

## 2024-05-13 ENCOUNTER — Other Ambulatory Visit: Payer: Self-pay | Admitting: *Deleted

## 2024-05-13 MED ORDER — LAMOTRIGINE ER 200 MG PO TB24: 200.0000 mg | ORAL_TABLET | Freq: Every day | ORAL | 2 refills | Status: AC

## 2024-05-13 NOTE — Telephone Encounter (Signed)
 Last seen on 03/23/23 Follow up scheduled on 12/31/24

## 2024-05-16 ENCOUNTER — Ambulatory Visit: Admitting: Emergency Medicine

## 2024-05-27 ENCOUNTER — Other Ambulatory Visit: Payer: Self-pay | Admitting: Emergency Medicine

## 2024-05-27 DIAGNOSIS — Z1231 Encounter for screening mammogram for malignant neoplasm of breast: Secondary | ICD-10-CM

## 2024-06-11 ENCOUNTER — Encounter (HOSPITAL_COMMUNITY): Payer: Self-pay

## 2024-06-13 ENCOUNTER — Other Ambulatory Visit (HOSPITAL_COMMUNITY): Payer: Self-pay | Admitting: Emergency Medicine

## 2024-06-13 DIAGNOSIS — Z1231 Encounter for screening mammogram for malignant neoplasm of breast: Secondary | ICD-10-CM

## 2024-06-14 ENCOUNTER — Inpatient Hospital Stay (HOSPITAL_COMMUNITY): Admission: RE | Admit: 2024-06-14 | Discharge: 2024-06-14

## 2024-06-14 DIAGNOSIS — Z1231 Encounter for screening mammogram for malignant neoplasm of breast: Secondary | ICD-10-CM

## 2024-06-19 ENCOUNTER — Ambulatory Visit

## 2024-06-19 DIAGNOSIS — Z1231 Encounter for screening mammogram for malignant neoplasm of breast: Secondary | ICD-10-CM

## 2024-07-31 ENCOUNTER — Ambulatory Visit
Admission: EM | Admit: 2024-07-31 | Discharge: 2024-07-31 | Disposition: A | Discharge status: Home / Self Care | Attending: Nurse Practitioner | Admitting: Nurse Practitioner

## 2024-07-31 DIAGNOSIS — J069 Acute upper respiratory infection, unspecified: Secondary | ICD-10-CM | POA: Diagnosis not present

## 2024-07-31 DIAGNOSIS — K5900 Constipation, unspecified: Secondary | ICD-10-CM | POA: Diagnosis not present

## 2024-07-31 LAB — POC COVID19/FLU A&B COMBO
Covid Antigen, POC: NEGATIVE
Influenza A Antigen, POC: NEGATIVE
Influenza B Antigen, POC: NEGATIVE

## 2024-07-31 MED ORDER — AZELASTINE HCL 0.1 % NA SOLN: 2.0000 | Freq: Two times a day (BID) | NASAL | 0 refills | Status: AC

## 2024-07-31 MED ORDER — SENNOSIDES-DOCUSATE SODIUM 8.6-50 MG PO TABS: 2.0000 | ORAL_TABLET | Freq: Every evening | ORAL | 0 refills | Status: AC | PRN

## 2024-07-31 MED ORDER — PROMETHAZINE-DM 6.25-15 MG/5ML PO SYRP: 5.0000 mL | ORAL_SOLUTION | Freq: Four times a day (QID) | ORAL | 0 refills | Status: AC | PRN

## 2024-07-31 NOTE — Discharge Instructions (Signed)
 The COVID/flu test was negative. Take medication as prescribed. Increase fluids and allow for plenty of rest. You may take over-the-counter Tylenol as needed for pain, fever, or general discomfort. Recommend warm salt water gargles 3-4 times daily as needed for throat pain or discomfort. For the cough, continue use of a humidifier at nighttime during sleep and sleeping elevated on pillows while symptoms persist. As discussed, your symptoms should improve over the next 5 to 7 days.  If symptoms fail to improve, or begin to worsen, you may follow-up in this clinic or with your primary care physician for further evaluation.  For your constipation: Continue the MiraLAX.  Recommend starting the MiraLAX twice daily until you have a good bowel movement. Take medication as prescribed. Increase fluids.  Try to drink at least 8-10 8 ounce glasses of water daily. Recommend foods to help increase fiber in your diet. Try to stay active. Go to the emergency department if you experience abdominal pain, nausea, vomiting, or other concerns. If your symptoms continue to persist, recommend follow-up with your primary care physician for further evaluation.  Follow-up as needed.

## 2024-07-31 NOTE — ED Triage Notes (Addendum)
 Pt reports cough, congestion, hoarseness, sinus drainage, fatigue x 2 days.  Denies fever. Pt also reports constipation that she can only make pebble sized BM's x 1 mo. Pt has not tried stool softeners but has been using Miralax has found little relief.

## 2024-07-31 NOTE — ED Provider Notes (Signed)
 " [facility]    CSN: [id number] Arrival date & time: 07/31/24  9187      History   Chief Complaint No chief complaint on file.   HPI Jane Heath is a 64 y.o. female.   The history is provided by the patient.   Patient presents with a 3 to 4-day history of cough, runny nose, and sore throat.  Patient denies fever, chills, headache, ear pain, ear drainage, wheezing, difficulty breathing, abdominal pain, nausea, vomiting, diarrhea, or rash.  States that she has been taking over-the-counter cough and cold medications for her symptoms with minimal relief.  She denies any obvious close sick contacts.  She also complains of constipation for the past month.  States that her bowel movements were like pebbles.  She denies fever, chills, abdominal pain, nausea, vomiting, bloody stools, gas, bloating, melena stools.  Patient states that she has been using MiraLAX daily with minimal relief.  Denies prior history of gastrointestinal disease.  Past Medical History:  Diagnosis Date   Bronchitis    COVID-19 virus infection 07/2019   OSA (obstructive sleep apnea)    no cpap- last sleep study told no OSA   Seizure (HCC) 06/2019   07-01-2021 last seizure 04-2020    Patient Active Problem List   Diagnosis Date Noted   Essential hypertension 02/14/2024   Concentration deficit 02/14/2024   History of ADHD 02/14/2024   Prediabetes 05/30/2022   Alopecia 03/29/2022   History of bilateral breast reduction surgery 03/29/2022   Dyslipidemia 03/29/2022   Seizure disorder (HCC) 01/02/2020    Past Surgical History:  Procedure Laterality Date   ABDOMINAL HYSTERECTOMY     1987   BREAST REDUCTION SURGERY  2004   COLONOSCOPY     last colon > 10 yrs ago- last 2 colon's pt states had polyps- we have no records , no path   POLYPECTOMY     REDUCTION MAMMAPLASTY Bilateral    tummy tuck  2008    OB History   No obstetric history on file.      Home Medications    Prior to  Admission medications  Medication Sig Start Date End Date Taking? Authorizing Provider  amLODipine  (NORVASC ) 5 MG tablet Take 1 tablet (5 mg total) by mouth daily. 02/14/24   Sagardia, Miguel Jose, MD  LamoTRIgine  200 MG TB24 24 hour tablet Take 1 tablet (200 mg total) by mouth daily. 05/13/24   Lomax, Amy, NP  rosuvastatin  (CRESTOR ) 10 MG tablet Take 1 tablet (10 mg total) by mouth daily. 02/14/24   Purcell Emil Schanz, MD  metFORMIN (GLUCOPHAGE) 500 MG tablet Take 500 mg by mouth daily.  07/21/19  [provider]    Family History Family History  Problem Relation Age of Onset   Hypertension Mother    Heart disease Mother    Hypertension Sister    Diabetes Brother    Hypertension Brother    Colon cancer Paternal Aunt    Heart disease Maternal Grandmother    Alzheimer's disease Other    Colon polyps Neg Hx    Esophageal cancer Neg Hx    Rectal cancer Neg Hx    Stomach cancer Neg Hx     Social History Social History[1]   Allergies   Patient has no known allergies.   Review of Systems Review of Systems Per HPI  Physical Exam Triage Vital Signs ED Triage Vitals  Encounter Vitals Group     BP 07/31/24 0839 (!) 149/88  Girls Systolic BP Percentile --      Girls Diastolic BP Percentile --      Boys Systolic BP Percentile --      Boys Diastolic BP Percentile --      Pulse Rate 07/31/24 0839 82     Resp 07/31/24 0839 20     Temp 07/31/24 0839 98.5 F (36.9 C)     Temp Source 07/31/24 0839 Oral     SpO2 07/31/24 0839 97 %     Weight --      Height --      Head Circumference --      Peak Flow --      Pain Score 07/31/24 0843 0     Pain Loc --      Pain Education --      Exclude from Growth Chart --    No data found.  Updated Vital Signs BP (!) 149/88 (BP Location: Right Arm)   Pulse 82   Temp 98.5 F (36.9 C) (Oral)   Resp 20   SpO2 97%   Visual Acuity Right Eye Distance:   Left Eye Distance:   Bilateral Distance:    Right Eye Near:   Left  Eye Near:    Bilateral Near:     Physical Exam Vitals and nursing note reviewed.  Constitutional:      General: She is not in acute distress.    Appearance: Normal appearance.  HENT:     Head: Normocephalic.     Right Ear: Tympanic membrane, ear canal and external ear normal.     Left Ear: Tympanic membrane, ear canal and external ear normal.     Nose: Congestion present.     Right Turbinates: Enlarged and swollen.     Left Turbinates: Enlarged and swollen.     Right Sinus: No maxillary sinus tenderness or frontal sinus tenderness.     Left Sinus: No maxillary sinus tenderness or frontal sinus tenderness.     Mouth/Throat:     Lips: Pink.     Mouth: Mucous membranes are moist.     Pharynx: Oropharynx is clear. Uvula midline. Postnasal drip present. No pharyngeal swelling, oropharyngeal exudate, posterior oropharyngeal erythema or uvula swelling.  Eyes:     Extraocular Movements: Extraocular movements intact.     Conjunctiva/sclera: Conjunctivae normal.     Pupils: Pupils are equal, round, and reactive to light.  Cardiovascular:     Rate and Rhythm: Normal rate and regular rhythm.     Pulses: Normal pulses.     Heart sounds: Normal heart sounds.  Pulmonary:     Effort: Pulmonary effort is normal. No respiratory distress.     Breath sounds: Normal breath sounds. No stridor. No wheezing, rhonchi or rales.  Abdominal:     General: Bowel sounds are normal.     Palpations: Abdomen is soft.     Tenderness: There is no abdominal tenderness.  Musculoskeletal:     Cervical back: Normal range of motion.  Skin:    General: Skin is warm and dry.  Neurological:     General: No focal deficit present.     Mental Status: She is alert and oriented to person, place, and time.  Psychiatric:        Mood and Affect: Mood normal.        Behavior: Behavior normal.      UC Treatments / Results  Labs (all labs ordered are listed, but only abnormal results are displayed) Labs Reviewed  POC  COVID19/FLU A&B COMBO    EKG   Radiology No results found.  Procedures Procedures (including critical care time)  Medications Ordered in UC Medications - No data to display  Initial Impression / Assessment and Plan / UC Course  I have reviewed the triage vital signs and the nursing notes.  Pertinent labs & imaging results that were available during my care of the patient were reviewed by me and considered in my medical decision making (see chart for details).  COVID/flu test is negative.  On exam, the patient is well-appearing, she is in no acute distress, vital signs are stable.  Symptoms consistent with viral etiology at this time.  Will provide symptomatic treatment with Promethazine  DM for the cough and azelastine  nasal spray for runny nose.  Supportive care recommendations were provided and discussed with the patient to include fluids, rest, over-the-counter analgesics, and use of a humidifier during sleep.  With regard to her constipation, we will treat with Senokot 8.6/50 mg tablets.  Supportive care recommendations to include increasing fluids, increasing the fiber in her diet, and remaining active.  Patient was given strict ER follow-up precautions, along with indications to follow-up with her PCP.  Patient was in agreement with this plan of care and verbalizes understanding.  All questions were answered.  Patient stable for discharge.  Final Clinical Impressions(s) / UC Diagnoses   Final diagnoses:  None   Discharge Instructions   None    ED Prescriptions   None    PDMP not reviewed this encounter.     [1]  Social History Tobacco Use   Smoking status: Never   Smokeless tobacco: Never  Vaping Use   Vaping status: Never Used  Substance Use Topics   Alcohol use: Yes    Comment: 2 x week   Drug use: Never     Gilmer Etta PARAS, NP 07/31/24 [phone]  "

## 2024-08-15 ENCOUNTER — Encounter: Payer: Self-pay | Admitting: Family Medicine

## 2024-12-13 ENCOUNTER — Ambulatory Visit: Payer: Self-pay

## 2024-12-31 ENCOUNTER — Ambulatory Visit: Admitting: Family Medicine
